# Patient Record
Sex: Female | Born: 1975 | Race: White | Hispanic: No | Marital: Married | Smoking: Current every day smoker
Health system: Southern US, Community
[De-identification: ages and names within clinical notes are randomized; demographics above are authoritative.]

## PROBLEM LIST (undated history)

## (undated) DIAGNOSIS — I2699 Other pulmonary embolism without acute cor pulmonale: Secondary | ICD-10-CM

## (undated) DIAGNOSIS — Z923 Personal history of irradiation: Secondary | ICD-10-CM

## (undated) DIAGNOSIS — D509 Iron deficiency anemia, unspecified: Secondary | ICD-10-CM

## (undated) DIAGNOSIS — I82409 Acute embolism and thrombosis of unspecified deep veins of unspecified lower extremity: Secondary | ICD-10-CM

## (undated) DIAGNOSIS — J189 Pneumonia, unspecified organism: Secondary | ICD-10-CM

## (undated) DIAGNOSIS — R011 Cardiac murmur, unspecified: Secondary | ICD-10-CM

## (undated) HISTORY — DX: Cardiac murmur, unspecified: R01.1

## (undated) HISTORY — PX: ANKLE FRACTURE SURGERY: SHX122

---

## 2003-11-29 HISTORY — PX: ANKLE FRACTURE SURGERY: SHX122

## 2015-08-05 ENCOUNTER — Other Ambulatory Visit: Payer: Self-pay | Admitting: Nurse Practitioner

## 2015-08-05 DIAGNOSIS — E049 Nontoxic goiter, unspecified: Secondary | ICD-10-CM

## 2015-08-07 ENCOUNTER — Ambulatory Visit
Admission: RE | Admit: 2015-08-07 | Discharge: 2015-08-07 | Disposition: A | Discharge status: Home / Self Care | Payer: BLUE CROSS/BLUE SHIELD | Source: Ambulatory Visit | Attending: Nurse Practitioner | Admitting: Nurse Practitioner

## 2015-08-07 DIAGNOSIS — E049 Nontoxic goiter, unspecified: Secondary | ICD-10-CM

## 2015-08-07 DIAGNOSIS — E042 Nontoxic multinodular goiter: Secondary | ICD-10-CM | POA: Insufficient documentation

## 2019-10-25 ENCOUNTER — Other Ambulatory Visit: Payer: Self-pay

## 2019-10-25 ENCOUNTER — Other Ambulatory Visit
Admission: RE | Admit: 2019-10-25 | Discharge: 2019-10-25 | Disposition: A | Discharge status: Home / Self Care | Payer: BC Managed Care – PPO | Source: Ambulatory Visit | Attending: Surgery | Admitting: Surgery

## 2019-10-25 DIAGNOSIS — Z01812 Encounter for preprocedural laboratory examination: Secondary | ICD-10-CM | POA: Diagnosis not present

## 2019-10-25 DIAGNOSIS — Z20828 Contact with and (suspected) exposure to other viral communicable diseases: Secondary | ICD-10-CM | POA: Insufficient documentation

## 2019-10-26 LAB — SARS CORONAVIRUS 2 (TAT 6-24 HRS): SARS Coronavirus 2: NEGATIVE

## 2019-10-28 ENCOUNTER — Other Ambulatory Visit: Payer: Self-pay | Admitting: Surgery

## 2019-10-28 DIAGNOSIS — S62025A Nondisplaced fracture of middle third of navicular [scaphoid] bone of left wrist, initial encounter for closed fracture: Secondary | ICD-10-CM | POA: Insufficient documentation

## 2019-10-29 ENCOUNTER — Encounter
Admission: RE | Admit: 2019-10-29 | Discharge: 2019-10-29 | Disposition: A | Discharge status: Home / Self Care | Payer: BC Managed Care – PPO | Source: Ambulatory Visit | Attending: Surgery | Admitting: Surgery

## 2019-10-29 ENCOUNTER — Other Ambulatory Visit: Payer: Self-pay

## 2019-10-29 NOTE — Patient Instructions (Signed)
Your procedure is scheduled on: October 30, 2019 [facility] Report to Day Surgery on the 2nd floor of the [facility]. To find out your arrival time, please call [phone] between 1PM - 3PM on: Tuesday October 29, 2019  REMEMBER: Instructions that are not followed completely may result in serious medical risk, up to and including death; or upon the discretion of your surgeon and anesthesiologist your surgery may need to be rescheduled.  Do not eat food after midnight the night before surgery.  No gum chewing, lozengers or hard candies.  You may however, drink CLEAR liquids up to 2 hours before you are scheduled to arrive for your surgery. Do not drink anything within 2 hours of the start of your surgery.  Clear liquids include: - water  - apple juice without pulp - gatorade - black coffee or tea (Do NOT add milk or creamers to the coffee or tea) Do NOT drink anything that is not on this list.  Type 1 and Type 2 diabetics should only drink water.  ENSURE PRE-SURGERY CARBOHYDRATE DRINK:  Complete drinking 3 hours prior to surgery. -PATIENT UNABLE TO PICK UP CARB [facility].  PATIENT INSTRUCTED TO GET GATORADE DRINK FROM STORE.  No Alcohol for 24 hours before or after surgery.  No Smoking including e-cigarettes for 24 hours prior to surgery.  No chewable tobacco products for at least 6 hours prior to surgery.  No nicotine patches on the day of surgery.  On the morning of surgery brush your teeth with toothpaste and water, you may rinse your mouth with mouthwash if you wish. Do not swallow any toothpaste or mouthwash.  Notify your doctor if there is any change in your medical condition (cold, fever, infection).  Do not wear jewelry, make-up, hairpins, clips or nail polish.  Do not wear lotions, powders, or perfumes.   Do not shave 48 hours prior to surgery.   Contacts and dentures may not be worn into surgery.  Do not bring valuables to the hospital, including  drivers license, insurance or credit cards.  [facility] is not responsible for any belongings or valuables.   TAKE THESE MEDICATIONS THE MORNING OF SURGERY: NONE  Stop Anti-inflammatories (NSAIDS) such as Advil, Aleve, Ibuprofen, Motrin, Naproxen, Naprosyn and Aspirin based products such as Excedrin, Goodys Powder, BC Powder. (May take Tylenol or Acetaminophen if needed.)  Stop ANY OVER THE COUNTER supplements until after surgery. (May continue Vitamin D, Vitamin B, and multivitamin.)  Wear comfortable clothing (specific to your surgery type) to the hospital.  Plan for stool softeners for home use.  If you are being discharged the day of surgery, you will not be allowed to drive home. You will need a responsible adult to drive you home and stay with you that night.   If you are taking public transportation, you will need to have a responsible adult with you. Please confirm with your physician that it is acceptable to use public transportation.   Please call [phone] if you have any questions about these instructions.

## 2019-10-29 NOTE — Pre-Procedure Instructions (Signed)
Instructions reviewed with patient over the phone

## 2019-10-30 ENCOUNTER — Ambulatory Visit: Payer: Worker's Compensation | Admitting: Anesthesiology

## 2019-10-30 ENCOUNTER — Other Ambulatory Visit: Payer: Self-pay

## 2019-10-30 ENCOUNTER — Ambulatory Visit
Admission: RE | Admit: 2019-10-30 | Discharge: 2019-10-30 | Disposition: A | Discharge status: Home / Self Care | Payer: Worker's Compensation | Attending: Surgery | Admitting: Surgery

## 2019-10-30 ENCOUNTER — Encounter
Admission: RE | Disposition: A | Discharge status: Home / Self Care | Payer: Self-pay | Source: Home / Self Care | Attending: Surgery

## 2019-10-30 DIAGNOSIS — W010XXA Fall on same level from slipping, tripping and stumbling without subsequent striking against object, initial encounter: Secondary | ICD-10-CM | POA: Insufficient documentation

## 2019-10-30 DIAGNOSIS — Z881 Allergy status to other antibiotic agents status: Secondary | ICD-10-CM | POA: Insufficient documentation

## 2019-10-30 DIAGNOSIS — S62012A Displaced fracture of distal pole of navicular [scaphoid] bone of left wrist, initial encounter for closed fracture: Secondary | ICD-10-CM | POA: Diagnosis not present

## 2019-10-30 DIAGNOSIS — Y990 Civilian activity done for income or pay: Secondary | ICD-10-CM | POA: Insufficient documentation

## 2019-10-30 DIAGNOSIS — F17200 Nicotine dependence, unspecified, uncomplicated: Secondary | ICD-10-CM | POA: Diagnosis not present

## 2019-10-30 HISTORY — PX: ORIF WRIST FRACTURE: SHX2133

## 2019-10-30 LAB — URINE DRUG SCREEN, QUALITATIVE (ARMC ONLY)
Amphetamines, Ur Screen: NOT DETECTED
Barbiturates, Ur Screen: NOT DETECTED
Benzodiazepine, Ur Scrn: NOT DETECTED
Cannabinoid 50 Ng, Ur ~~LOC~~: POSITIVE — AB
Cocaine Metabolite,Ur ~~LOC~~: NOT DETECTED
MDMA (Ecstasy)Ur Screen: NOT DETECTED
Methadone Scn, Ur: NOT DETECTED
Opiate, Ur Screen: NOT DETECTED
Phencyclidine (PCP) Ur S: NOT DETECTED
Tricyclic, Ur Screen: NOT DETECTED

## 2019-10-30 LAB — POCT PREGNANCY, URINE: Preg Test, Ur: NEGATIVE

## 2019-10-30 SURGERY — OPEN REDUCTION INTERNAL FIXATION (ORIF) WRIST FRACTURE
Anesthesia: General | Site: Wrist | Laterality: Left

## 2019-10-30 MED ORDER — FENTANYL CITRATE (PF) 100 MCG/2ML IJ SOLN
INTRAMUSCULAR | Status: AC
  Administered 2019-10-30: 25 ug via INTRAVENOUS
  Filled 2019-10-30: qty 2

## 2019-10-30 MED ORDER — BUPIVACAINE HCL (PF) 0.5 % IJ SOLN
INTRAMUSCULAR | Status: AC
  Filled 2019-10-30: qty 30

## 2019-10-30 MED ORDER — FENTANYL CITRATE (PF) 100 MCG/2ML IJ SOLN
25.0000 ug | INTRAMUSCULAR | Status: DC | PRN
  Administered 2019-10-30 (×4): 25 ug via INTRAVENOUS

## 2019-10-30 MED ORDER — OXYCODONE HCL 5 MG PO TABS
ORAL_TABLET | ORAL | Status: AC
  Filled 2019-10-30: qty 1

## 2019-10-30 MED ORDER — OXYCODONE HCL 5 MG PO TABS
5.0000 mg | ORAL_TABLET | Freq: Once | ORAL | Status: AC | PRN
  Administered 2019-10-30: 5 mg via ORAL

## 2019-10-30 MED ORDER — PROMETHAZINE HCL 25 MG/ML IJ SOLN: 6.2500 mg | INTRAMUSCULAR | Status: DC | PRN

## 2019-10-30 MED ORDER — OXYCODONE HCL 5 MG/5ML PO SOLN: 5.0000 mg | Freq: Once | ORAL | Status: AC | PRN

## 2019-10-30 MED ORDER — PROPOFOL 10 MG/ML IV BOLUS
INTRAVENOUS | Status: AC
  Filled 2019-10-30: qty 20

## 2019-10-30 MED ORDER — FAMOTIDINE 20 MG PO TABS
ORAL_TABLET | ORAL | Status: AC
  Administered 2019-10-30: 20 mg via ORAL
  Filled 2019-10-30: qty 1

## 2019-10-30 MED ORDER — DEXAMETHASONE SODIUM PHOSPHATE 10 MG/ML IJ SOLN
INTRAMUSCULAR | Status: DC | PRN
  Administered 2019-10-30: 10 mg via INTRAVENOUS

## 2019-10-30 MED ORDER — ONDANSETRON HCL 4 MG/2ML IJ SOLN
INTRAMUSCULAR | Status: DC | PRN
  Administered 2019-10-30: 4 mg via INTRAVENOUS

## 2019-10-30 MED ORDER — MIDAZOLAM HCL 2 MG/2ML IJ SOLN
INTRAMUSCULAR | Status: DC | PRN
  Administered 2019-10-30: 2 mg via INTRAVENOUS

## 2019-10-30 MED ORDER — MIDAZOLAM HCL 2 MG/2ML IJ SOLN
INTRAMUSCULAR | Status: AC
  Filled 2019-10-30: qty 2

## 2019-10-30 MED ORDER — CEFAZOLIN SODIUM-DEXTROSE 2-4 GM/100ML-% IV SOLN
INTRAVENOUS | Status: AC
  Filled 2019-10-30: qty 100

## 2019-10-30 MED ORDER — LIDOCAINE HCL (CARDIAC) PF 100 MG/5ML IV SOSY
PREFILLED_SYRINGE | INTRAVENOUS | Status: DC | PRN
  Administered 2019-10-30: 80 mg via INTRAVENOUS

## 2019-10-30 MED ORDER — BUPIVACAINE HCL (PF) 0.5 % IJ SOLN
INTRAMUSCULAR | Status: DC | PRN
  Administered 2019-10-30: 3 mL

## 2019-10-30 MED ORDER — FAMOTIDINE 20 MG PO TABS
20.0000 mg | ORAL_TABLET | Freq: Once | ORAL | Status: AC
  Administered 2019-10-30: 08:00:00 20 mg via ORAL

## 2019-10-30 MED ORDER — FENTANYL CITRATE (PF) 100 MCG/2ML IJ SOLN
INTRAMUSCULAR | Status: DC | PRN
  Administered 2019-10-30 (×2): 50 ug via INTRAVENOUS

## 2019-10-30 MED ORDER — MEPERIDINE HCL 50 MG/ML IJ SOLN: 6.2500 mg | INTRAMUSCULAR | Status: DC | PRN

## 2019-10-30 MED ORDER — LACTATED RINGERS IV SOLN
INTRAVENOUS | Status: DC
  Administered 2019-10-30: 50 mL/h via INTRAVENOUS

## 2019-10-30 MED ORDER — PROPOFOL 10 MG/ML IV BOLUS
INTRAVENOUS | Status: DC | PRN
  Administered 2019-10-30: 150 mg via INTRAVENOUS

## 2019-10-30 MED ORDER — CEFAZOLIN SODIUM-DEXTROSE 2-4 GM/100ML-% IV SOLN
2.0000 g | INTRAVENOUS | Status: AC
  Administered 2019-10-30: 09:00:00 2 g via INTRAVENOUS

## 2019-10-30 MED ORDER — FENTANYL CITRATE (PF) 250 MCG/5ML IJ SOLN
INTRAMUSCULAR | Status: AC
  Filled 2019-10-30: qty 5

## 2019-10-30 MED ORDER — CHLORHEXIDINE GLUCONATE 4 % EX LIQD
60.0000 mL | Freq: Once | CUTANEOUS | Status: AC
  Administered 2019-10-30: 4 via TOPICAL

## 2019-10-30 SURGICAL SUPPLY — 46 items
BIT DRILL CANN 2.4 (BIT) ×2
BIT DRILL CANN MAX VPC 2.4 (BIT) IMPLANT
BNDG COHESIVE 4X5 TAN STRL (GAUZE/BANDAGES/DRESSINGS) ×2 IMPLANT
BNDG ESMARK 4X12 TAN STRL LF (GAUZE/BANDAGES/DRESSINGS) ×2 IMPLANT
CANISTER SUCT 1200ML W/VALVE (MISCELLANEOUS) ×2 IMPLANT
CHLORAPREP W/TINT 26 (MISCELLANEOUS) ×4 IMPLANT
CORD BIP STRL DISP 12FT (MISCELLANEOUS) ×2 IMPLANT
COVER WAND RF STERILE (DRAPES) ×2 IMPLANT
CUFF TOURN DUAL QUICK 18 (TOURNIQUET CUFF) ×1 IMPLANT
CUFF TOURN SGL QUICK 18X4 (TOURNIQUET CUFF) IMPLANT
DRAPE FLUOR MINI C-ARM 54X84 (DRAPES) ×2 IMPLANT
DRAPE SPLIT 6X30 W/TAPE (DRAPES) ×2 IMPLANT
DRAPE SURG 17X11 SM STRL (DRAPES) ×2 IMPLANT
DRAPE U-SHAPE 47X51 STRL (DRAPES) ×2 IMPLANT
ELECT CAUTERY BLADE 6.4 (BLADE) ×2 IMPLANT
ELECT REM PT RETURN 9FT ADLT (ELECTROSURGICAL) ×2
ELECTRODE REM PT RTRN 9FT ADLT (ELECTROSURGICAL) ×1 IMPLANT
FORCEPS JEWEL BIP 4-3/4 STR (INSTRUMENTS) ×2 IMPLANT
GAUZE SPONGE 4X4 12PLY STRL (GAUZE/BANDAGES/DRESSINGS) ×2 IMPLANT
GAUZE XEROFORM 1X8 LF (GAUZE/BANDAGES/DRESSINGS) ×2 IMPLANT
GLOVE BIO SURGEON STRL SZ8 (GLOVE) ×2 IMPLANT
GLOVE INDICATOR 8.0 STRL GRN (GLOVE) ×2 IMPLANT
GOWN STRL REUS W/ TWL LRG LVL3 (GOWN DISPOSABLE) ×1 IMPLANT
GOWN STRL REUS W/ TWL XL LVL3 (GOWN DISPOSABLE) ×1 IMPLANT
GOWN STRL REUS W/TWL LRG LVL3 (GOWN DISPOSABLE) ×1
GOWN STRL REUS W/TWL XL LVL3 (GOWN DISPOSABLE) ×1
K-WIRE COCR 1.1X105 (WIRE) ×2
KIT TURNOVER KIT A (KITS) ×2 IMPLANT
KWIRE COCR 1.1X105 (WIRE) IMPLANT
NDL FILTER BLUNT 18X1 1/2 (NEEDLE) ×1 IMPLANT
NEEDLE FILTER BLUNT 18X 1/2SAF (NEEDLE) ×1
NEEDLE FILTER BLUNT 18X1 1/2 (NEEDLE) ×1 IMPLANT
NS IRRIG 500ML POUR BTL (IV SOLUTION) ×2 IMPLANT
PACK EXTREMITY ARMC (MISCELLANEOUS) ×2 IMPLANT
PAD CAST CTTN 4X4 STRL (SOFTGOODS) ×2 IMPLANT
PADDING CAST COTTON 4X4 STRL (SOFTGOODS) ×2
SCREW MAX VPC 3.4X24MM (Screw) ×1 IMPLANT
SPLINT CAST 1 STEP 3X12 (MISCELLANEOUS) ×2 IMPLANT
STAPLER SKIN PROX 35W (STAPLE) ×1 IMPLANT
STOCKINETTE IMPERVIOUS 9X36 MD (GAUZE/BANDAGES/DRESSINGS) ×2 IMPLANT
SUT PROLENE 4 0 PS 2 18 (SUTURE) ×2 IMPLANT
SUT VIC AB 2-0 SH 27 (SUTURE) ×1
SUT VIC AB 2-0 SH 27XBRD (SUTURE) ×1 IMPLANT
SUT VIC AB 3-0 SH 27 (SUTURE) ×1
SUT VIC AB 3-0 SH 27X BRD (SUTURE) ×1 IMPLANT
SYR 10ML LL (SYRINGE) ×2 IMPLANT

## 2019-10-30 NOTE — Anesthesia Post-op Follow-up Note (Signed)
Anesthesia QCDR form completed.        

## 2019-10-30 NOTE — Op Note (Signed)
10/30/2019  9:40 AM  Pre-Op Diagnosis:   Scaphoid waist fracture, left wrist.  Post-Op Diagnosis:   Same.  Procedure:   Percutaneous screw fixation of scaphoid fracture, left wrist.  Surgeon:   Pascal Lux, MD  Assistant:   Larrie Kass, PA-S  Anesthesia:   General LMA  Findings:   As above.  Complications:   None  Fluids:   500 cc crystalloid  EBL:   0 cc  TT:   30 minutes at 250 mmHg  Drains:   None  Closure:   4-0 Prolene interrupted suture  Implants:   Biomet 3.4 x 24 mm Herbert screw  Brief Clinical Note:   The patient is a 43 year old female who sustained the above-noted injury 2 weeks ago following a fall onto her outstretched left hand. Initial x-rays demonstrated an essentially nondisplaced waist fracture of the left scaphoid. However, follow-up x-rays suggested some displacement of the fracture, so she presents at this time for definitive management of this injury.  Procedure:   The patient was brought into the operating room and lain in the supine position. After adequate general laryngeal mask anesthesia was obtained, the patient's left hand and upper extremity were prepped with ChloraPrep solution before being draped sterilely. Preoperative antibiotics were administered. After performing a timeout to verify the appropriate surgical site, the limb was exsanguinated with an Esmarch and the tourniquet inflated to 250 mmHg.   Under FluoroScan guidance, a short stab incision was made at the volar radial aspect of the scaphoid. A 1.1 mm guidewire was inserted across the scaphoid beginning at the base of the thumb and advancing it proximally and dorsally into the proximal fragment. After several attempts, the optimal position was obtained. The position of the guidewire was verified in in AP, lateral, and oblique projections. The guidewire was measured to determine the appropriate screw length before it overdrilled with the appropriate sized cannulated drill. The 3.4 x  24 mm Herbert screw was then inserted and advanced across the fracture into the proximal fragment under FluoroScan guidance where the fracture site was noted to compress as expected. Again, the position of the screw was verified in AP, lateral, and oblique projections using the FluoroScan unit and found to be satisfactory.  The wound was irrigated with sterile saline solution before being closed using a single 4-0 Prolene interrupted suture. A total of 3 cc of 0.5% plain Sensorcaine was injected in and around the incision to help with postoperative analgesia before a sterile bulky dressing was applied to the wound. The patient was placed into a thumb spica splint before the patient was awakened, extubated, and returned to the recovery room in satisfactory condition after tolerating the procedure well.

## 2019-10-30 NOTE — Transfer of Care (Signed)
Immediate Anesthesia Transfer of Care Note  Patient: Maria Young  Procedure(s) Performed: OPEN REDUCTION INTERNAL FIXATION (ORIF) WRIST FRACTURE with percutaneous screw (Left Wrist)  Patient Location: PACU  Anesthesia Type:General  Level of Consciousness: awake, alert  and oriented  Airway & Oxygen Therapy: Patient Spontanous Breathing and Patient connected to face mask oxygen  Post-op Assessment: Report given to RN and Post -op Vital signs reviewed and stable  Post vital signs: Reviewed and stable  Last Vitals:  Vitals Value Taken Time  BP 135/93 10/30/19 0935  Temp 36.4 C 10/30/19 0935  Pulse 73 10/30/19 0937  Resp 16 10/30/19 0937  SpO2 100 % 10/30/19 0937  Vitals shown include unvalidated device data.  Last Pain:  Vitals:   10/30/19 0725  TempSrc: Tympanic  PainSc: 0-No pain         Complications: No apparent anesthesia complications

## 2019-10-30 NOTE — Anesthesia Preprocedure Evaluation (Signed)
Anesthesia Evaluation  Patient identified by MRN, date of birth, ID band Patient awake    Reviewed: Allergy & Precautions, NPO status , Patient's Chart, lab work & pertinent test results  History of Anesthesia Complications Negative for: history of anesthetic complications  Airway Mallampati: II  TM Distance: >3 FB Neck ROM: Full    Dental  (+) Caps   Pulmonary neg sleep apnea, neg COPD, Current SmokerPatient did not abstain from smoking.,    breath sounds clear to auscultation- rhonchi (-) wheezing      Cardiovascular Exercise Tolerance: Good (-) hypertension(-) CAD, (-) Past MI, (-) Cardiac Stents and (-) CABG  Rhythm:Regular Rate:Normal - Systolic murmurs and - Diastolic murmurs    Neuro/Psych neg Seizures negative neurological ROS  negative psych ROS   GI/Hepatic negative GI ROS, Neg liver ROS,   Endo/Other  negative endocrine ROSneg diabetes  Renal/GU negative Renal ROS     Musculoskeletal negative musculoskeletal ROS (+)   Abdominal (+) - obese,   Peds  Hematology negative hematology ROS (+)   Anesthesia Other Findings   Reproductive/Obstetrics                             Anesthesia Physical Anesthesia Plan  ASA: II  Anesthesia Plan: General   Post-op Pain Management:    Induction: Intravenous  PONV Risk Score and Plan: 1 and Ondansetron, Dexamethasone and Midazolam  Airway Management Planned: LMA  Additional Equipment:   Intra-op Plan:   Post-operative Plan:   Informed Consent: I have reviewed the patients History and Physical, chart, labs and discussed the procedure including the risks, benefits and alternatives for the proposed anesthesia with the patient or authorized representative who has indicated his/her understanding and acceptance.     Dental advisory given  Plan Discussed with: CRNA and Anesthesiologist  Anesthesia Plan Comments:          Anesthesia Quick Evaluation

## 2019-10-30 NOTE — H&P (Signed)
Paper H&P to be scanned into permanent record. H&P reviewed and patient re-examined. No changes. 

## 2019-10-30 NOTE — Anesthesia Postprocedure Evaluation (Signed)
Anesthesia Post Note  Patient: Ledora Bottcher  Procedure(s) Performed: OPEN REDUCTION INTERNAL FIXATION (ORIF) WRIST FRACTURE with percutaneous screw (Left Wrist)  Patient location during evaluation: PACU Anesthesia Type: General Level of consciousness: awake and alert and oriented Pain management: pain level controlled Vital Signs Assessment: post-procedure vital signs reviewed and stable Respiratory status: spontaneous breathing, nonlabored ventilation and respiratory function stable Cardiovascular status: blood pressure returned to baseline and stable Postop Assessment: no signs of nausea or vomiting Anesthetic complications: no     Last Vitals:  Vitals:   10/30/19 1020 10/30/19 1028  BP: 134/79 126/75  Pulse: 62   Resp: 17   Temp: (!) 36.4 C   SpO2: 100%     Last Pain:  Vitals:   10/30/19 1020  TempSrc: Tympanic  PainSc: 4                  Gilman Olazabal

## 2019-10-30 NOTE — Discharge Instructions (Addendum)
° °  Orthopedic discharge instructions: Keep splint dry and intact. Keep hand elevated above heart level. Apply ice to affected area frequently. Take ibuprofen 600-800 mg TID with meals for 7-10 days, then as necessary. Take pain medication as prescribed or ES Tylenol when needed.  Return for follow-up in 10-14 days or as scheduled.   AMBULATORY SURGERY  DISCHARGE INSTRUCTIONS   1) The drugs that you were given will stay in your system until tomorrow so for the next 24 hours you should not:  A) Drive an automobile B) Make any legal decisions C) Drink any alcoholic beverage   2) You may resume regular meals tomorrow.  Today it is better to start with liquids and gradually work up to solid foods.  You may eat anything you prefer, but it is better to start with liquids, then soup and crackers, and gradually work up to solid foods.   3) Please notify your doctor immediately if you have any unusual bleeding, trouble breathing, redness and pain at the surgery site, drainage, fever, or pain not relieved by medication.    4) Additional Instructions:        Please contact your physician with any problems or Same Day Surgery at [phone], Monday through Friday 6 am to 4 pm, or [place] at [facility] number at [phone].  Oxycodone 108m IR taken 10/30/19 at 10:05am

## 2019-10-31 ENCOUNTER — Encounter: Payer: Self-pay | Admitting: Surgery

## 2019-11-19 ENCOUNTER — Encounter: Payer: Self-pay | Admitting: *Deleted

## 2020-03-12 ENCOUNTER — Ambulatory Visit: Payer: BC Managed Care – PPO

## 2020-03-13 ENCOUNTER — Ambulatory Visit: Payer: BC Managed Care – PPO

## 2020-03-13 ENCOUNTER — Ambulatory Visit: Payer: Self-pay | Attending: Internal Medicine

## 2020-03-13 ENCOUNTER — Other Ambulatory Visit: Payer: Self-pay

## 2020-03-13 DIAGNOSIS — Z23 Encounter for immunization: Secondary | ICD-10-CM

## 2020-03-13 NOTE — Progress Notes (Signed)
   Covid-19 Vaccination Clinic  Name:  Maria Young    MRN: [id number] DOB: [birth year]  03/13/2020  [name] was observed post Covid-19 immunization for 15 minutes without incident. She was provided with Vaccine Information Sheet and instruction to access the V-Safe system.   [name] was instructed to call 911 with any severe reactions post vaccine: Marland Kitchen Difficulty breathing  . Swelling of face and throat  . A fast heartbeat  . A bad rash all over body  . Dizziness and weakness   Immunizations Administered    Name Date Dose VIS Date Route   Pfizer COVID-19 Vaccine 03/13/2020 10:20 AM 0.3 mL 11/08/2019 Intramuscular   Manufacturer: [employer]   Lot: [id number]   [place]: [address]-1

## 2020-04-08 ENCOUNTER — Ambulatory Visit: Payer: Self-pay | Attending: Internal Medicine

## 2020-04-08 DIAGNOSIS — Z23 Encounter for immunization: Secondary | ICD-10-CM

## 2020-04-08 NOTE — Progress Notes (Signed)
   Covid-19 Vaccination Clinic  Name:  Maria Young    MRN: [id number] DOB: 1976/02/15  04/08/2020  [name] was observed post Covid-19 immunization for 15 minutes without incident. She was provided with Vaccine Information Sheet and instruction to access the V-Safe system.   [name] was instructed to call 911 with any severe reactions post vaccine: Marland Kitchen Difficulty breathing  . Swelling of face and throat  . A fast heartbeat  . A bad rash all over body  . Dizziness and weakness   Immunizations Administered    Name Date Dose VIS Date Route   Pfizer COVID-19 Vaccine 04/08/2020 10:33 AM 0.3 mL 01/22/2019 Intramuscular   Manufacturer: [place]   Lot: [id number]   [place]: [id number]

## 2020-06-11 ENCOUNTER — Other Ambulatory Visit
Admission: RE | Admit: 2020-06-11 | Discharge: 2020-06-11 | Disposition: A | Discharge status: Home / Self Care | Payer: BC Managed Care – PPO | Source: Ambulatory Visit | Attending: Family Medicine | Admitting: Family Medicine

## 2020-06-11 ENCOUNTER — Emergency Department: Payer: BC Managed Care – PPO

## 2020-06-11 ENCOUNTER — Other Ambulatory Visit: Payer: Self-pay

## 2020-06-11 ENCOUNTER — Emergency Department
Admission: EM | Admit: 2020-06-11 | Discharge: 2020-06-11 | Disposition: A | Discharge status: Home / Self Care | Payer: BC Managed Care – PPO | Attending: Emergency Medicine | Admitting: Emergency Medicine

## 2020-06-11 ENCOUNTER — Encounter: Payer: Self-pay | Admitting: Emergency Medicine

## 2020-06-11 DIAGNOSIS — R0789 Other chest pain: Secondary | ICD-10-CM | POA: Diagnosis present

## 2020-06-11 DIAGNOSIS — I2699 Other pulmonary embolism without acute cor pulmonale: Secondary | ICD-10-CM | POA: Insufficient documentation

## 2020-06-11 DIAGNOSIS — R7989 Other specified abnormal findings of blood chemistry: Secondary | ICD-10-CM | POA: Insufficient documentation

## 2020-06-11 DIAGNOSIS — F17210 Nicotine dependence, cigarettes, uncomplicated: Secondary | ICD-10-CM | POA: Diagnosis not present

## 2020-06-11 HISTORY — DX: Other pulmonary embolism without acute cor pulmonale: I26.99

## 2020-06-11 LAB — CBC
HCT: 39.8 % (ref 36.0–46.0)
Hemoglobin: 13.9 g/dL (ref 12.0–15.0)
MCH: 34.2 pg — ABNORMAL HIGH (ref 26.0–34.0)
MCHC: 34.9 g/dL (ref 30.0–36.0)
MCV: 97.8 fL (ref 80.0–100.0)
Platelets: 235 10*3/uL (ref 150–400)
RBC: 4.07 MIL/uL (ref 3.87–5.11)
RDW: 11.9 % (ref 11.5–15.5)
WBC: 6.5 10*3/uL (ref 4.0–10.5)
nRBC: 0 % (ref 0.0–0.2)

## 2020-06-11 LAB — TROPONIN I (HIGH SENSITIVITY)
Troponin I (High Sensitivity): 3 ng/L (ref <18)
Troponin I (High Sensitivity): 3 ng/L (ref <18)

## 2020-06-11 LAB — POCT PREGNANCY, URINE: Preg Test, Ur: NEGATIVE

## 2020-06-11 LAB — BASIC METABOLIC PANEL
Anion gap: 7 (ref 5–15)
BUN: 10 mg/dL (ref 6–20)
CO2: 23 mmol/L (ref 22–32)
Calcium: 9.2 mg/dL (ref 8.9–10.3)
Chloride: 107 mmol/L (ref 98–111)
Creatinine, Ser: 0.72 mg/dL (ref 0.44–1.00)
GFR calc Af Amer: >60 mL/min (ref >60)
GFR calc non Af Amer: >60 mL/min (ref >60)
Glucose, Bld: 109 mg/dL — ABNORMAL HIGH (ref 70–99)
Potassium: 3.7 mmol/L (ref 3.5–5.1)
Sodium: 137 mmol/L (ref 135–145)

## 2020-06-11 LAB — FIBRIN DERIVATIVES D-DIMER (ARMC ONLY): Fibrin derivatives D-dimer (ARMC): 1080.16 ng/mL (FEU) — ABNORMAL HIGH (ref 0.00–499.00)

## 2020-06-11 MED ORDER — ONDANSETRON HCL 4 MG/2ML IJ SOLN
4.0000 mg | Freq: Once | INTRAMUSCULAR | Status: AC
  Administered 2020-06-11: 4 mg via INTRAVENOUS
  Filled 2020-06-11: qty 2

## 2020-06-11 MED ORDER — ONDANSETRON HCL 8 MG PO TABS: 8.0000 mg | ORAL_TABLET | Freq: Three times a day (TID) | ORAL | 0 refills | Status: DC | PRN

## 2020-06-11 MED ORDER — OXYCODONE-ACETAMINOPHEN 5-325 MG PO TABS: 1.0000 | ORAL_TABLET | ORAL | 0 refills | Status: DC | PRN

## 2020-06-11 MED ORDER — APIXABAN (ELIQUIS) VTE STARTER PACK (10MG AND 5MG): ORAL_TABLET | ORAL | 0 refills | Status: DC

## 2020-06-11 MED ORDER — MORPHINE SULFATE (PF) 4 MG/ML IV SOLN
4.0000 mg | Freq: Once | INTRAVENOUS | Status: DC
  Filled 2020-06-11: qty 1

## 2020-06-11 MED ORDER — APIXABAN 5 MG PO TABS: 5.0000 mg | ORAL_TABLET | Freq: Two times a day (BID) | ORAL | 0 refills | Status: DC

## 2020-06-11 MED ORDER — ONDANSETRON HCL 8 MG PO TABS: 8.0000 mg | ORAL_TABLET | Freq: Three times a day (TID) | ORAL | 0 refills | Status: AC | PRN

## 2020-06-11 MED ORDER — IOHEXOL 350 MG/ML SOLN
75.0000 mL | Freq: Once | INTRAVENOUS | Status: AC | PRN
  Administered 2020-06-11: 75 mL via INTRAVENOUS
  Filled 2020-06-11: qty 75

## 2020-06-11 MED ORDER — APIXABAN 5 MG PO TABS
10.0000 mg | ORAL_TABLET | Freq: Once | ORAL | Status: AC
  Administered 2020-06-11: 10 mg via ORAL
  Filled 2020-06-11: qty 2

## 2020-06-11 MED ORDER — OXYCODONE-ACETAMINOPHEN 5-325 MG PO TABS
1.0000 | ORAL_TABLET | Freq: Once | ORAL | Status: AC
  Administered 2020-06-11: 1 via ORAL
  Filled 2020-06-11: qty 1

## 2020-06-11 NOTE — ED Provider Notes (Signed)
-----------------------------------------   8:57 PM on 06/11/2020 -----------------------------------------  Blood pressure (!) 154/83, pulse (!) 54, temperature 98.1 F (36.7 C), temperature source Oral, resp. rate 18, height 6' 2"  (1.88 m), weight 86.2 kg, SpO2 99 %.  Assuming care from [facility], NP-C.  In short, Maria Young is a 44 y.o. female with a chief complaint of Chest Pain .  Refer to the original H&P for additional details.  The current plan of care is to await CTA results and disposition appropriately.  _____________________________________  RADIOLOGY  CT Angio Chest PE w w/o CM  IMPRESSION: 1. Positive for small, distal acute pulmonary emboli in the bilateral lower lobes. Negative for proximal or central embolus.  2. Mild pulmonary atelectasis. No convincing pulmonary infarct. No pleural effusion.  3. Subpleural 6 mm right middle lobe pulmonary nodule. Non-contrast chest CT at 6-12 months is recommended. If the nodule is stable at time of repeat CT, then future CT at 18-24 months (from today's scan) is considered optional for low-risk patients, but is recommended for high-risk patients. This recommendation follows the consensus statement: Guidelines for Management of Incidental Pulmonary Nodules Detected on CT Images: From the Fleischner Society 2017; Radiology 2017; 284:228-243. ____________________________________________  PROCEDURES  apixaban 10 mg PO _______________________________________________  DISPOSITION  Patient with confirmed bilateral, distal, small pulmonary emboli on CT angio. She reports improvement of her pain with pain medicine and with upright positioning. She will be started in apixaban in the ED. She is hemodynamically stable and without acute distress. She will be managed in the outpatient setting, and referred to [facility] pulmonary medicine. Bleeding precautions and NSAID avoidance information is provided. Return precautions have also  been reviewed.    Melvenia Needles, PA-C 06/11/20 2102    Arta Silence, MD 06/11/20 [phone]

## 2020-06-11 NOTE — ED Triage Notes (Signed)
Pt in via POV, sent over from [facility] for further evaluation of an elevated d-dimer.  Pt reports intermittent chest pain to left lung base, worse with deep inhalation, sudden onset last night.  Vitals WDL.  Pt tearful in triage.

## 2020-06-11 NOTE — ED Notes (Signed)
Pt presentation discussed with EDP, Archie Balboa; no new orders at this time.

## 2020-06-11 NOTE — Discharge Instructions (Addendum)
Your exam, labs, and CT scan are consistent with a pulmonary embolism. Take the prescription meds as directed. Follow-up with Pulmonary Medicine for further management. Return to the ED as needed. Avoid anti-inflammatories while on this therapy.

## 2020-06-11 NOTE — ED Notes (Signed)
ED Provider at bedside. 

## 2020-06-11 NOTE — ED Provider Notes (Signed)
[facility] Emergency Department Provider Note  ____________________________________________   First MD Initiated Contact with Patient 06/11/20 1721     (approximate)  I have reviewed the triage vital signs and the nursing notes.   HISTORY  Chief Complaint Chest Pain   HPI Maria Young is a 44 y.o. female presents to the emergency department for treatment and evaluation of pain in the left anterior chest.  Pain started suddenly and awakened her from sleep last night.  Pain increases with deep breath.   No injury.  No previous DVT or PE.  No family history of DVT or PE that she is aware of.  She does state cigarettes.  She was sent here from [facility] clinic after having an elevated D-dimer.  No alleviating measures attempted prior to arrival  History reviewed. No pertinent past medical history.  There are no problems to display for this patient.   Past Surgical History:  Procedure Laterality Date   ANKLE FRACTURE SURGERY Right    internal fixation   ORIF WRIST FRACTURE Left 10/30/2019   Procedure: OPEN REDUCTION INTERNAL FIXATION (ORIF) WRIST FRACTURE with percutaneous screw;  Surgeon: Corky Mull, MD;  Location: [facility] ORS;  Service: Orthopedics;  Laterality: Left;    Prior to Admission medications   Medication Sig Start Date End Date Taking? Authorizing Provider  ibuprofen (ADVIL) 200 MG tablet Take 600 mg by mouth every 8 (eight) hours as needed (pain).    [provider]    Allergies Azithromycin  No family history on file.  Social History Social History   Tobacco Use   Smoking status: Current Every Day Smoker    Packs/day: 0.50    Types: Cigarettes   Smokeless tobacco: Never Used  Vaping Use   Vaping Use: Never used  Substance Use Topics   Alcohol use: Yes    Alcohol/week: 14.0 standard drinks    Types: 14 Glasses of wine per week   Drug use: Yes    Types: Marijuana    Review of Systems  Constitutional: No  fever/chills. Eyes: No visual changes. ENT: No sore throat. Cardiovascular: Positive for chest pain.  Positive for pleuritic pain.  Negative for palpitations.  Negative for leg pain. Respiratory: Negative for shortness of breath. Gastrointestinal: No abdominal pain.  No nausea, no vomiting.  No diarrhea.  No constipation. Genitourinary: Negative for dysuria. Musculoskeletal: Negative for back pain.  Skin: Negative for rash, lesion, wound. Neurological: Negative for headaches, focal weakness or numbness. ____________________________________________   PHYSICAL EXAM:  VITAL SIGNS: ED Triage Vitals  Enc Vitals Group     BP 06/11/20 1519 (!) 157/84     Pulse Rate 06/11/20 1519 68     Resp 06/11/20 1519 18     Temp 06/11/20 1519 98.1 F (36.7 C)     Temp Source 06/11/20 1519 Oral     SpO2 06/11/20 1519 98 %     Weight 06/11/20 1532 190 lb (86.2 kg)     Height 06/11/20 1532 6' 2"  (1.88 m)     Head Circumference --      Peak Flow --      Pain Score 06/11/20 1532 3     Pain Loc --      Pain Edu? --      Excl. in [place]? --     Constitutional: Alert and oriented.  Uncomfortable appearing and in no acute distress.  Normal mental status. Eyes: Conjunctivae are normal. PERRL. Head: Atraumatic. Nose: No congestion/rhinnorhea. Mouth/Throat: Mucous  membranes are moist.  Oropharynx non-erythematous. Tongue normal in size and color. Neck: No stridor.  No carotid bruit appreciated on exam. Hematological/Lymphatic/Immunilogical: No cervical lymphadenopathy. Cardiovascular: Normal rate, regular rhythm. Grossly normal heart sounds.  Good peripheral circulation. Respiratory: Normal respiratory effort.  No retractions. Lungs CTAB. Gastrointestinal: Soft and nontender. No distention. No abdominal bruits. No CVA tenderness. Genitourinary: Exam deferred. Musculoskeletal: No lower extremity tenderness.  No edema of extremities. Neurologic:  Normal speech and language. No gross focal neurologic  deficits are appreciated. Skin:  Skin is warm, dry and intact. No rash noted. Psychiatric: Mood and affect are normal. Speech and behavior are normal.  ____________________________________________   LABS (all labs ordered are listed, but only abnormal results are displayed)  Labs Reviewed  BASIC METABOLIC PANEL - Abnormal; Notable for the following components:      Result Value   Glucose, Bld 109 (*)    All other components within normal limits  CBC - Abnormal; Notable for the following components:   MCH 34.2 (*)    All other components within normal limits  POC URINE PREG, ED  POCT PREGNANCY, URINE  TROPONIN I (HIGH SENSITIVITY)  TROPONIN I (HIGH SENSITIVITY)   ____________________________________________  EKG  ED ECG REPORT I, Earla Charlie, FNP-BC personally viewed and interpreted this ECG.   Date: 06/11/2020  EKG Time: 1533  Rate: 73  Rhythm: normal EKG, normal sinus rhythm  Axis: normal  Intervals:none  ST&T Change: no ST elevation  ____________________________________________  RADIOLOGY  ED MD interpretation:  pending  I, Hanae Waiters, personally viewed and evaluated these images (plain radiographs) as part of my medical decision making, as well as reviewing the written report by the radiologist.  Official radiology report(s): No results found.  ____________________________________________   PROCEDURES  Procedure(s) performed: None  Procedures  Critical Care performed: No  ____________________________________________   INITIAL IMPRESSION / ASSESSMENT AND PLAN / ED COURSE  44 year old female presents emergency department for treatment and evaluation of sudden onset left anterior chest wall pain.  See HPI for further details.  She presented to care clinic initially and had lab work done.  Her D-dimer was elevated to 1080.16.  She continues to experience significant left anterior chest wall pain.  Plan will be to get a CT for PE.   ____________________________________________  Differential diagnosis includes, but not limited to:  Pulmonary embolus, costochondritis, NSTEMI   FINAL CLINICAL IMPRESSION(S) / ED DIAGNOSES  Awaiting CT.  Patient care will be transferred to [facility] PA-C who will follow up on results.  Final diagnoses:  Elevated d-dimer     ED Discharge Orders    None       Maria Young was evaluated in Emergency Department on 06/11/2020 for the symptoms described in the history of present illness. She was evaluated in the context of the global COVID-19 pandemic, which necessitated consideration that the patient might be at risk for infection with the SARS-CoV-2 virus that causes COVID-19. Institutional protocols and algorithms that pertain to the evaluation of patients at risk for COVID-19 are in a state of rapid change based on information released by regulatory bodies including the CDC and federal and state organizations. These policies and algorithms were followed during the patient's care in the ED.   Note:  This document was prepared using Dragon voice recognition software and may include unintentional dictation errors.   Victorino Dike, FNP 06/11/20 [place] Drop    Lavonia Drafts, MD 06/11/20 2024

## 2020-06-25 ENCOUNTER — Other Ambulatory Visit: Payer: Self-pay | Admitting: Internal Medicine

## 2020-06-25 DIAGNOSIS — Z1231 Encounter for screening mammogram for malignant neoplasm of breast: Secondary | ICD-10-CM

## 2020-10-20 ENCOUNTER — Other Ambulatory Visit: Payer: Self-pay | Admitting: Specialist

## 2020-10-20 DIAGNOSIS — F17200 Nicotine dependence, unspecified, uncomplicated: Secondary | ICD-10-CM

## 2020-10-20 DIAGNOSIS — R911 Solitary pulmonary nodule: Secondary | ICD-10-CM

## 2020-10-20 DIAGNOSIS — I2699 Other pulmonary embolism without acute cor pulmonale: Secondary | ICD-10-CM

## 2020-12-07 ENCOUNTER — Ambulatory Visit: Payer: BC Managed Care – PPO

## 2020-12-18 ENCOUNTER — Other Ambulatory Visit: Payer: Self-pay

## 2020-12-18 ENCOUNTER — Ambulatory Visit
Admission: RE | Admit: 2020-12-18 | Discharge: 2020-12-18 | Disposition: A | Discharge status: Home / Self Care | Payer: BC Managed Care – PPO | Source: Ambulatory Visit | Attending: Specialist | Admitting: Specialist

## 2020-12-18 DIAGNOSIS — I2699 Other pulmonary embolism without acute cor pulmonale: Secondary | ICD-10-CM | POA: Diagnosis present

## 2020-12-18 DIAGNOSIS — F17200 Nicotine dependence, unspecified, uncomplicated: Secondary | ICD-10-CM | POA: Insufficient documentation

## 2020-12-18 DIAGNOSIS — R911 Solitary pulmonary nodule: Secondary | ICD-10-CM | POA: Diagnosis not present

## 2020-12-30 ENCOUNTER — Other Ambulatory Visit (HOSPITAL_COMMUNITY): Payer: Self-pay | Admitting: Specialist

## 2020-12-30 ENCOUNTER — Other Ambulatory Visit: Payer: Self-pay | Admitting: Specialist

## 2020-12-30 DIAGNOSIS — F17200 Nicotine dependence, unspecified, uncomplicated: Secondary | ICD-10-CM

## 2020-12-30 DIAGNOSIS — R911 Solitary pulmonary nodule: Secondary | ICD-10-CM

## 2021-01-26 ENCOUNTER — Emergency Department: Payer: BC Managed Care – PPO

## 2021-01-26 ENCOUNTER — Emergency Department
Admission: EM | Admit: 2021-01-26 | Discharge: 2021-01-26 | Disposition: A | Discharge status: Home / Self Care | Payer: BC Managed Care – PPO | Attending: Student in an Organized Health Care Education/Training Program | Admitting: Student in an Organized Health Care Education/Training Program

## 2021-01-26 ENCOUNTER — Other Ambulatory Visit: Payer: Self-pay

## 2021-01-26 ENCOUNTER — Encounter: Payer: Self-pay | Admitting: Emergency Medicine

## 2021-01-26 DIAGNOSIS — I82452 Acute embolism and thrombosis of left peroneal vein: Secondary | ICD-10-CM | POA: Diagnosis not present

## 2021-01-26 DIAGNOSIS — F17210 Nicotine dependence, cigarettes, uncomplicated: Secondary | ICD-10-CM | POA: Insufficient documentation

## 2021-01-26 DIAGNOSIS — Z7901 Long term (current) use of anticoagulants: Secondary | ICD-10-CM | POA: Diagnosis not present

## 2021-01-26 DIAGNOSIS — R600 Localized edema: Secondary | ICD-10-CM | POA: Diagnosis present

## 2021-01-26 DIAGNOSIS — I82409 Acute embolism and thrombosis of unspecified deep veins of unspecified lower extremity: Secondary | ICD-10-CM

## 2021-01-26 HISTORY — DX: Other pulmonary embolism without acute cor pulmonale: I26.99

## 2021-01-26 HISTORY — DX: Acute embolism and thrombosis of unspecified deep veins of unspecified lower extremity: I82.409

## 2021-01-26 MED ORDER — APIXABAN 5 MG PO TABS: 5.0000 mg | ORAL_TABLET | Freq: Two times a day (BID) | ORAL | 0 refills | Status: DC

## 2021-01-26 NOTE — ED Provider Notes (Signed)
[facility] Emergency Department Provider Note  ____________________________________________   Event Date/Time   First MD Initiated Contact with Patient 01/26/21 1716     (approximate)  I have reviewed the triage vital signs and the nursing notes.   HISTORY  Chief Complaint Leg Swelling   HPI Maria Young is a 45 y.o. female presents to the ED with complaint of swelling to her left leg without history of injury.  Patient denies any recent traveling.  She reports that she has been off the Eliquis for 2 weeks for her previous DVT.  She was initially seen at [facility] and brought to the ED to rule out DVT of the left leg.  Currently she rates her pain as 6 out of 10.       Past Medical History:  Diagnosis Date  . DVT (deep venous thrombosis) ([place])   . PE (pulmonary thromboembolism) ([place])     There are no problems to display for this patient.   Past Surgical History:  Procedure Laterality Date  . ANKLE FRACTURE SURGERY Right    internal fixation  . ORIF WRIST FRACTURE Left 10/30/2019   Procedure: OPEN REDUCTION INTERNAL FIXATION (ORIF) WRIST FRACTURE with percutaneous screw;  Surgeon: Corky Mull, MD;  Location: [facility] ORS;  Service: Orthopedics;  Laterality: Left;    Prior to Admission medications   Medication Sig Start Date End Date Taking? Authorizing Provider  apixaban (ELIQUIS) 5 MG TABS tablet Take 1 tablet (5 mg total) by mouth 2 (two) times daily. 01/26/21 03/27/21  Johnn Hai, PA-C    Allergies Azithromycin  No family history on file.  Social History Social History   Tobacco Use  . Smoking status: Current Every Day Smoker    Packs/day: 0.50    Types: Cigarettes  . Smokeless tobacco: Never Used  Vaping Use  . Vaping Use: Never used  Substance Use Topics  . Alcohol use: Yes    Alcohol/week: 14.0 standard drinks    Types: 14 Glasses of wine per week  . Drug use: Yes    Types: Marijuana    Review of  Systems Constitutional: No fever/chills Cardiovascular: Denies chest pain. Respiratory: Denies shortness of breath. Gastrointestinal: No abdominal pain.  No nausea, no vomiting.  Genitourinary: Negative for dysuria. Musculoskeletal: Positive left lower extremity pain. Skin: Negative for rash. Neurological: Negative for headaches, focal weakness or numbness. ____________________________________________   PHYSICAL EXAM:  VITAL SIGNS: ED Triage Vitals  Enc Vitals Group     BP 01/26/21 1723 (!) 146/77     Pulse Rate 01/26/21 1723 66     Resp 01/26/21 1723 17     Temp 01/26/21 1723 98.8 F (37.1 C)     Temp Source 01/26/21 1723 Oral     SpO2 01/26/21 1723 100 %     Weight 01/26/21 1715 195 lb (88.5 kg)     Height 01/26/21 1715 6' 2"  (1.88 m)     Head Circumference --      Peak Flow --      Pain Score 01/26/21 1719 6     Pain Loc --      Pain Edu? --      Excl. in [place]? --     Constitutional: Alert and oriented. Well appearing and in no acute distress. Eyes: Conjunctivae are normal.  Head: Atraumatic. Neck: No stridor.   Cardiovascular: Normal rate, regular rhythm. Grossly normal heart sounds.  Good peripheral circulation. Respiratory: Normal respiratory effort.  No retractions. Lungs  CTAB. Gastrointestinal: Soft and nontender. No distention.  Musculoskeletal: No erythema or warmth is noted on examination of the left lower extremity.  There is tenderness on palpation of the calf area and a positive Homans' sign.  Pulses present.  No skin discoloration or abrasions are seen. Neurologic:  Normal speech and language. No gross focal neurologic deficits are appreciated.  Skin:  Skin is warm, dry and intact. No rash noted. Psychiatric: Mood and affect are normal. Speech and behavior are normal.  ____________________________________________   LABS (all labs ordered are listed, but only abnormal results are displayed)  Labs Reviewed - No data to display   RADIOLOGY I, Johnn Hai, personally viewed and evaluated these images (plain radiographs) as part of my medical decision making, as well as reviewing the written report by the radiologist.   Official radiology report(s): US Venous Img Lower Unilateral Left  Result Date: 01/26/2021 CLINICAL DATA:  Swelling and pain EXAM: LEFT LOWER EXTREMITY VENOUS DOPPLER ULTRASOUND TECHNIQUE: Gray-scale sonography with compression, as well as color and duplex ultrasound, were performed to evaluate the deep venous system(s) from the level of the common femoral vein through the popliteal and proximal calf veins. COMPARISON:  None. FINDINGS: VENOUS Normal compressibility of the common femoral, superficial femoral, and popliteal veins. There is occlusive thrombus within the left peroneal vein. There is no thrombus within the left posterior tibial vein. Visualized portions of profunda femoral vein and great saphenous vein unremarkable. No filling defects to suggest DVT on grayscale or color Doppler imaging. Doppler waveforms show normal direction of venous flow, normal respiratory plasticity and response to augmentation. Limited views of the contralateral common femoral vein are unremarkable. OTHER None. Limitations: none IMPRESSION: The study is positive for occlusive thrombus within the left peroneal vein. Electronically Signed   By: Constance Holster M.D.   On: 01/26/2021 18:44    ____________________________________________   PROCEDURES  Procedure(s) performed (including Critical Care):  Procedures   ____________________________________________   INITIAL IMPRESSION / ASSESSMENT AND PLAN / ED COURSE  As part of my medical decision making, I reviewed the following data within the electronic MEDICAL RECORD NUMBER Notes from prior ED visits and [place] Controlled Substance Database  45 year old female presents to the ED with complaint of left leg pain that started yesterday.  Patient states that she has a history of DVT and was started  on Eliquis last year.  Patient was taken off of Eliquis 2 weeks ago for blood test to be done on Monday.  Patient was brought over from [facility] today for evaluation of her left leg pain.  Physical exam is suspicious for DVT as she is moderately tender in the calf area.  Pulses are present both DP and PT.  Skin is intact.  Patient was made aware of the ultrasound findings.  We discussed restarting the Eliquis.  She has a trip planned which involves flying.  She was encouraged to call her doctor to talk about this and also let them know that she is back on Eliquis.  Patient reports that she has 5 tablets at home currently and a prescription was sent to her pharmacy. ____________________________________________   FINAL CLINICAL IMPRESSION(S) / ED DIAGNOSES  Final diagnoses:  Acute deep vein thrombosis (DVT) of left peroneal vein [facility])     ED Discharge Orders         Ordered    apixaban (ELIQUIS) 5 MG TABS tablet  2 times daily,   Status:  Discontinued  01/26/21 1902    apixaban (ELIQUIS) 5 MG TABS tablet  2 times daily        01/26/21 1906          *Please note:  Maria Young was evaluated in Emergency Department on 01/26/2021 for the symptoms described in the history of present illness. She was evaluated in the context of the global COVID-19 pandemic, which necessitated consideration that the patient might be at risk for infection with the SARS-CoV-2 virus that causes COVID-19. Institutional protocols and algorithms that pertain to the evaluation of patients at risk for COVID-19 are in a state of rapid change based on information released by regulatory bodies including the CDC and federal and state organizations. These policies and algorithms were followed during the patient's care in the ED.  Some ED evaluations and interventions may be delayed as a result of limited staffing during and the pandemic.*   Note:  This document was prepared using Dragon voice recognition software  and may include unintentional dictation errors.    Johnn Hai, PA-C 01/26/21 1911    Delman Kitten, MD 01/26/21 Sharilyn Sites

## 2021-01-26 NOTE — Discharge Instructions (Addendum)
Follow-up with your primary care provider to let them know that you are back on Eliquis.  The prescription was sent to your pharmacy.  You may take Tylenol with this medication if needed but do not take anti-inflammatories.  Also call Dr. Raul Del about your future travel plans.

## 2021-01-26 NOTE — ED Triage Notes (Signed)
Presents with pain and swelling to left leg  HX of blood clots in past and has been off her Elquis for 2 weeks  Brought over form [place]

## 2021-04-16 ENCOUNTER — Other Ambulatory Visit: Payer: Self-pay | Admitting: Specialist

## 2021-04-16 DIAGNOSIS — F17200 Nicotine dependence, unspecified, uncomplicated: Secondary | ICD-10-CM

## 2021-04-16 DIAGNOSIS — R911 Solitary pulmonary nodule: Secondary | ICD-10-CM

## 2021-06-29 ENCOUNTER — Ambulatory Visit: Payer: BC Managed Care – PPO

## 2021-07-09 ENCOUNTER — Ambulatory Visit
Admission: RE | Admit: 2021-07-09 | Discharge: 2021-07-09 | Disposition: A | Discharge status: Home / Self Care | Payer: BC Managed Care – PPO | Source: Ambulatory Visit | Attending: Specialist | Admitting: Specialist

## 2021-07-09 ENCOUNTER — Other Ambulatory Visit: Payer: Self-pay

## 2021-07-09 DIAGNOSIS — R911 Solitary pulmonary nodule: Secondary | ICD-10-CM | POA: Diagnosis not present

## 2021-07-09 DIAGNOSIS — F17200 Nicotine dependence, unspecified, uncomplicated: Secondary | ICD-10-CM | POA: Diagnosis present

## 2021-07-13 ENCOUNTER — Other Ambulatory Visit: Payer: Self-pay | Admitting: Specialist

## 2021-07-13 DIAGNOSIS — R918 Other nonspecific abnormal finding of lung field: Secondary | ICD-10-CM

## 2021-07-29 ENCOUNTER — Other Ambulatory Visit: Payer: Self-pay | Admitting: Internal Medicine

## 2021-07-29 DIAGNOSIS — M79606 Pain in leg, unspecified: Secondary | ICD-10-CM

## 2021-07-29 DIAGNOSIS — M7989 Other specified soft tissue disorders: Secondary | ICD-10-CM

## 2021-08-13 ENCOUNTER — Ambulatory Visit
Admission: RE | Admit: 2021-08-13 | Discharge: 2021-08-13 | Disposition: A | Discharge status: Home / Self Care | Payer: BC Managed Care – PPO | Source: Ambulatory Visit | Attending: Internal Medicine | Admitting: Internal Medicine

## 2021-08-13 ENCOUNTER — Other Ambulatory Visit: Payer: Self-pay

## 2021-08-13 DIAGNOSIS — M79606 Pain in leg, unspecified: Secondary | ICD-10-CM | POA: Diagnosis not present

## 2021-08-13 DIAGNOSIS — M7989 Other specified soft tissue disorders: Secondary | ICD-10-CM | POA: Insufficient documentation

## 2022-01-28 ENCOUNTER — Other Ambulatory Visit: Payer: Self-pay | Admitting: Internal Medicine

## 2022-01-28 DIAGNOSIS — Z1231 Encounter for screening mammogram for malignant neoplasm of breast: Secondary | ICD-10-CM

## 2022-03-11 ENCOUNTER — Ambulatory Visit
Admission: RE | Admit: 2022-03-11 | Discharge: 2022-03-11 | Disposition: A | Discharge status: Home / Self Care | Payer: BC Managed Care – PPO | Source: Ambulatory Visit | Attending: Internal Medicine | Admitting: Internal Medicine

## 2022-03-11 DIAGNOSIS — Z1231 Encounter for screening mammogram for malignant neoplasm of breast: Secondary | ICD-10-CM | POA: Diagnosis not present

## 2022-03-17 ENCOUNTER — Other Ambulatory Visit: Payer: Self-pay | Admitting: Internal Medicine

## 2022-03-17 DIAGNOSIS — N6489 Other specified disorders of breast: Secondary | ICD-10-CM

## 2022-03-17 DIAGNOSIS — R928 Other abnormal and inconclusive findings on diagnostic imaging of breast: Secondary | ICD-10-CM

## 2022-03-20 LAB — COLOGUARD: COLOGUARD: NEGATIVE

## 2022-04-04 ENCOUNTER — Ambulatory Visit
Admission: RE | Admit: 2022-04-04 | Discharge: 2022-04-04 | Disposition: A | Discharge status: Home / Self Care | Payer: BC Managed Care – PPO | Source: Ambulatory Visit | Attending: Internal Medicine | Admitting: Internal Medicine

## 2022-04-04 DIAGNOSIS — R928 Other abnormal and inconclusive findings on diagnostic imaging of breast: Secondary | ICD-10-CM

## 2022-04-04 DIAGNOSIS — N6489 Other specified disorders of breast: Secondary | ICD-10-CM | POA: Diagnosis present

## 2022-04-06 ENCOUNTER — Other Ambulatory Visit: Payer: Self-pay | Admitting: Internal Medicine

## 2022-04-06 DIAGNOSIS — N63 Unspecified lump in unspecified breast: Secondary | ICD-10-CM

## 2022-04-06 DIAGNOSIS — R928 Other abnormal and inconclusive findings on diagnostic imaging of breast: Secondary | ICD-10-CM

## 2022-04-13 ENCOUNTER — Ambulatory Visit
Admission: RE | Admit: 2022-04-13 | Discharge: 2022-04-13 | Disposition: A | Discharge status: Home / Self Care | Payer: BC Managed Care – PPO | Source: Ambulatory Visit | Attending: Internal Medicine | Admitting: Internal Medicine

## 2022-04-13 DIAGNOSIS — R928 Other abnormal and inconclusive findings on diagnostic imaging of breast: Secondary | ICD-10-CM | POA: Insufficient documentation

## 2022-04-13 DIAGNOSIS — C50919 Malignant neoplasm of unspecified site of unspecified female breast: Secondary | ICD-10-CM

## 2022-04-13 DIAGNOSIS — N63 Unspecified lump in unspecified breast: Secondary | ICD-10-CM | POA: Insufficient documentation

## 2022-04-13 HISTORY — PX: BREAST BIOPSY: SHX20

## 2022-04-13 HISTORY — DX: Malignant neoplasm of unspecified site of unspecified female breast: C50.919

## 2022-04-14 ENCOUNTER — Encounter: Payer: Self-pay | Admitting: *Deleted

## 2022-04-14 ENCOUNTER — Other Ambulatory Visit: Payer: Self-pay | Admitting: *Deleted

## 2022-04-14 DIAGNOSIS — C50919 Malignant neoplasm of unspecified site of unspecified female breast: Secondary | ICD-10-CM

## 2022-04-14 LAB — SURGICAL PATHOLOGY

## 2022-04-14 NOTE — Progress Notes (Signed)
Received referral for newly diagnosed breast cancer from [facility] Radiology.  Navigation initiated.  Med Onc and Surgical consults scheduled. Dr. Lysle Pearl 04/18/22 and Dr. Grayland Ormond 04/19/22.

## 2022-04-17 DIAGNOSIS — Z170 Estrogen receptor positive status [ER+]: Secondary | ICD-10-CM | POA: Insufficient documentation

## 2022-04-17 NOTE — Progress Notes (Unsigned)
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  INTERVAL HISTORY: Patient is a 46 year old female who recently underwent her initial screening mammogram.  She was noted to have an abnormality in imaging which led to ultrasound and biopsy confirming the above-stated malignancy.  She currently feels well and is asymptomatic.  She has no neurologic complaints.  She denies any recent fevers or illnesses.  She has a good appetite and denies weight loss.  She has no chest pain, shortness of breath, cough, or hemoptysis.  She denies any nausea, vomiting, constipation, or diarrhea.  She has no urinary complaints.  Patient feels at her baseline and offers no specific complaints today.  REVIEW OF SYSTEMS:   Review of Systems  Constitutional: Negative.  Negative for fever, malaise/fatigue and weight loss.  Respiratory: Negative.  Negative for cough, hemoptysis and shortness of breath.   Cardiovascular: Negative.  Negative for chest pain and leg swelling.  Gastrointestinal: Negative.  Negative for abdominal pain.  Genitourinary: Negative.  Negative for dysuria.  Musculoskeletal: Negative.  Negative for back pain.  Skin: Negative.  Negative for rash.  Neurological: Negative.  Negative for dizziness, focal weakness, weakness and headaches.  Psychiatric/Behavioral: Negative.  The patient is not nervous/anxious.    As per HPI. Otherwise, a complete review of systems is negative.  PAST MEDICAL HISTORY: Past Medical History:  Diagnosis Date   DVT (deep venous thrombosis) ([facility])    Heart murmur    PE (pulmonary thromboembolism) ([place])     PAST  SURGICAL HISTORY: Past Surgical History:  Procedure Laterality Date   ANKLE FRACTURE SURGERY Right    internal fixation   BREAST BIOPSY Left 04/13/2022   [place] Core bx coil clip-path pending   ORIF WRIST FRACTURE Left 10/30/2019   Procedure: OPEN REDUCTION INTERNAL FIXATION (ORIF) WRIST FRACTURE with percutaneous screw;  Surgeon: Corky Mull, MD;  Location: [facility] ORS;  Service: Orthopedics;  Laterality: Left;    FAMILY HISTORY: Family History  Problem Relation Age of Onset   Diabetes Father    Heart disease Father    Hyperlipidemia Brother    Cancer Paternal Grandfather     ADVANCED DIRECTIVES (Y/N):  N  HEALTH MAINTENANCE: Social History   Tobacco Use   Smoking status: Every Day    Packs/day: 0.50    Types: Cigarettes   Smokeless tobacco: Never  Vaping Use   Vaping Use: Never used  Substance Use Topics   Alcohol use: Yes    Alcohol/week: 14.0 standard drinks    Types: 14 Glasses of wine per week   Drug use: Yes    Types: Marijuana     Colonoscopy:  PAP:  Bone density:  Lipid panel:  Allergies  Allergen Reactions   Azithromycin Hives and Itching    Current Outpatient Medications  Medication Sig Dispense Refill   apixaban (ELIQUIS) 5 MG TABS tablet Take 1 tablet (5 mg total) by mouth 2 (two) times daily. 120 tablet 0   amLODipine (NORVASC) 2.5 MG tablet Take by mouth. (Patient not taking: Reported on 04/19/2022)     No current facility-administered medications for this visit.    OBJECTIVE: Vitals:   04/19/22 1353  BP: (!) 145/95  Pulse: 66  Resp: 16  Temp: (!) 97.4 F (36.3 C)  SpO2: 100%     Body mass index is 25.42 kg/m.    ECOG FS:0 - Asymptomatic  General: Well-developed, well-nourished, no acute distress. Eyes: Pink conjunctiva, anicteric sclera. HEENT: Normocephalic, moist mucous membranes. Breast: Exam recently performed by another provider. Lungs: No audible wheezing or coughing. Heart: Regular rate and rhythm. Abdomen: Soft, nontender,  no obvious distention. Musculoskeletal: No edema, cyanosis, or clubbing. Neuro: Alert, answering all questions appropriately. Cranial nerves grossly intact. Skin: No rashes or petechiae noted. Psych: Normal affect. Lymphatics: No cervical, calvicular, axillary or inguinal LAD.   LAB RESULTS:  Lab Results  Component Value Date   NA 137 06/11/2020   K 3.7 06/11/2020   CL 107 06/11/2020   CO2 23 06/11/2020   GLUCOSE 109 (H) 06/11/2020   BUN 10 06/11/2020   CREATININE 0.72 06/11/2020   CALCIUM 9.2 06/11/2020   GFRNONAA >60 06/11/2020   GFRAA >60 06/11/2020    Lab Results  Component Value Date   WBC 6.5 06/11/2020   HGB 13.9 06/11/2020   HCT 39.8 06/11/2020   MCV 97.8 06/11/2020   PLT 235 06/11/2020     STUDIES: US BREAST LTD UNI LEFT INC AXILLA  Result Date: 04/04/2022 CLINICAL DATA:  Patient was recalled from screening mammogram for a possible mass in the left breast. EXAM: DIGITAL DIAGNOSTIC UNILATERAL LEFT MAMMOGRAM WITH TOMOSYNTHESIS AND CAD; ULTRASOUND LEFT BREAST LIMITED TECHNIQUE: Left digital diagnostic mammography and breast tomosynthesis was performed. The images were evaluated with computer-aided detection.; Targeted ultrasound examination of the left breast was performed. COMPARISON:  Baseline screening mammogram dated 03/11/2022. ACR Breast Density Category c: The breast tissue is heterogeneously dense, which may obscure small masses. FINDINGS: Additional imaging of the left breast was performed. There is persistence of an 8 mm mass in the posterior third of the lateral aspect of the breast best seen on the cc view. Targeted ultrasound is performed, showing a hypoechoic mass with indistinct margins measuring 8 x 6 x 7 mm. Sonographic evaluation of the left axilla does not show any enlarged adenopathy. IMPRESSION: Indeterminate 8 mm mass in the 2 o'clock region of the left breast. RECOMMENDATION: Ultrasound-guided core biopsy of the mass in the 2 o'clock region of the left  breast is recommended. I have discussed the findings and recommendations with the patient. If applicable, a reminder letter will be sent to the patient regarding the next appointment. BI-RADS CATEGORY  4: Suspicious. Electronically Signed   By: Lillia Mountain M.D.   On: 04/04/2022 11:07  MM DIAG BREAST TOMO UNI LEFT  Result Date: 04/04/2022 CLINICAL DATA:  Patient was recalled from screening mammogram for a possible mass in the left breast. EXAM: DIGITAL DIAGNOSTIC UNILATERAL LEFT MAMMOGRAM WITH TOMOSYNTHESIS AND CAD; ULTRASOUND LEFT BREAST LIMITED TECHNIQUE: Left digital diagnostic mammography and breast tomosynthesis was performed. The images were evaluated with computer-aided detection.; Targeted ultrasound examination of the left breast was performed. COMPARISON:  Baseline screening mammogram dated 03/11/2022. ACR Breast Density Category c: The breast tissue is heterogeneously dense, which may obscure small masses. FINDINGS: Additional imaging of the left breast was performed. There is persistence of an 8 mm mass in the posterior third of the lateral aspect of the breast best seen on the cc view. Targeted ultrasound is performed, showing a hypoechoic mass with indistinct margins measuring 8 x 6 x 7 mm. Sonographic evaluation of the left axilla does not show any enlarged adenopathy. IMPRESSION: Indeterminate 8 mm mass  in the 2 o'clock region of the left breast. RECOMMENDATION: Ultrasound-guided core biopsy of the mass in the 2 o'clock region of the left breast is recommended. I have discussed the findings and recommendations with the patient. If applicable, a reminder letter will be sent to the patient regarding the next appointment. BI-RADS CATEGORY  4: Suspicious. Electronically Signed   By: Lillia Mountain M.D.   On: 04/04/2022 11:07  MM CLIP PLACEMENT LEFT  Result Date: 04/13/2022 CLINICAL DATA:  Evaluate biopsy marker EXAM: 3D DIAGNOSTIC LEFT MAMMOGRAM POST ULTRASOUND BIOPSY COMPARISON:  Previous exam(s).  FINDINGS: 3D Mammographic images were obtained following ultrasound guided biopsy of a left breast mass. The biopsy marking clip is in expected position at the site of biopsy. IMPRESSION: Appropriate positioning of the coil shaped biopsy marking clip at the site of biopsy in the biopsied left breast mass. Final Assessment: Post Procedure Mammograms for Marker Placement Electronically Signed   By: Dorise Bullion III M.D.   On: 04/13/2022 10:07  [place] LT BREAST BX W LOC DEV 1ST LESION IMG BX SPEC US GUIDE  Addendum Date: 04/19/2022   ADDENDUM REPORT: 04/19/2022 14:39 ADDENDUM: Pathology revealed GRADE 1 INVASIVE MAMMARY CARCINOMA, WITH MUCINOUS FEATURES of the LEFT breast, 2 o'clock (coil clip). This was found to be concordant by Dr. Dorise Bullion. Pathology results were discussed with the patient by telephone. The patient reported doing well after the biopsy with tenderness at the site. Post biopsy instructions and care were reviewed and questions were answered. The patient was encouraged to call [facility] for any additional concerns. A surgical referral will be arranged by Casper Harrison RN Oncology Navigator of [place]. Pathology results reported by Stacie Acres RN on 04/14/2022. Electronically Signed   By: Dorise Bullion III M.D.   On: 04/19/2022 14:39   Result Date: 04/19/2022 CLINICAL DATA:  Biopsy of a 2 o'clock left breast mass EXAM: ULTRASOUND GUIDED LEFT BREAST CORE NEEDLE BIOPSY COMPARISON:  None Available. PROCEDURE: I met with the patient and we discussed the procedure of ultrasound-guided biopsy, including benefits and alternatives. We discussed the high likelihood of a successful procedure. We discussed the risks of the procedure, including infection, bleeding, tissue injury, clip migration, and inadequate sampling. Informed written consent was given. The usual time-out protocol was performed immediately prior to  the procedure. Lesion quadrant: 2 o'clock left breast mass Using sterile technique and 1% Lidocaine as local anesthetic, under direct ultrasound visualization, a 12 gauge spring-loaded device was used to perform biopsy of a 2 o'clock left breast mass using a lateral approach. At the conclusion of the procedure a tissue marker clip was deployed into the biopsy cavity. Follow up 2 view mammogram was performed and dictated separately. IMPRESSION: Ultrasound guided biopsy of a 2 o'clock left breast mass. No apparent complications. Electronically Signed: By: Dorise Bullion III M.D. 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  It is unlikely she will require adjuvant chemotherapy, but will send Oncotype to confirm.  Patient will definitely benefit from adjuvant XRT and referral has been sent to radiation oncology.  Finally, given the ER/PR  status of her malignancy she will benefit from tamoxifen for 5 years.  Return to clinic on May 12, 2022 for further evaluation, discussion of her final pathology results, and additional treatment planning. Iron deficiency anemia: Patient states this is longstanding secondary to heavy menses.  She cannot tolerate oral iron supplementation.  We will further evaluate once treatment for breast cancer is complete. 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  Her first blood clot was noted on CT of the chest on June 11, 2020 which she states coincided with COVID-vaccine.  Patient was off treatment for several weeks and by report had a DVT on January 26, 2021. Unclear what work-up has been done up to this point.  Patient will likely require  full work-up in the future. Genetics: Given patient's age, she was also given a genetics referral.  I spent a total of 60 minutes reviewing chart data, face-to-face evaluation with the patient, counseling and coordination of care as detailed above.   Patient expressed understanding and was in agreement with this plan. She also understands that She can call clinic at any time with any questions, concerns, or complaints.    Cancer Staging  Carcinoma of upper-outer quadrant of left breast in female, estrogen receptor positive ([place]) Staging form: Breast, AJCC 8th Edition - Clinical stage from 04/17/2022: Stage IA (cT1b, cN0, cM0, G1, ER+, PR+, HER2-) - Signed by Lloyd Huger, MD on 04/17/2022 Stage prefix: Initial diagnosis Histologic grading system: 3 grade system  Lloyd Huger, MD   04/20/2022 6:39 AM

## 2022-04-18 ENCOUNTER — Ambulatory Visit: Payer: Self-pay | Admitting: Surgery

## 2022-04-18 NOTE — H&P (View-Only) (Signed)
Subjective:  CC: Malignant neoplasm of upper-outer quadrant of left breast in female, estrogen receptor positive (CMS-HCC) [C50.412, Z17.0] HPI:  Maria Young is a 46 y.o. female who was referred by Gladstone Lighter, MD for evaluation of above. Change was noted on last screening mammogram. Patient does not routinely do self breast exams. Age of menarche was 21. Patient denies hormonal therapy. Patient is G1P1. Age of first live birth was 87. Patient did not breast feed. Patient denies nipple discharge. Patient denies previous breast biopsy. Patient denies a personal history of breast cancer.   Past Medical History:  has a past medical history of H/O blood clots, Heart murmur, unspecified, Menorrhagia with irregular cycle, Pneumonia, Tobacco use, and Viral meningitis.  Past Surgical History:  has a past surgical history that includes Percutaneous screw fixation of scaphoid fracture, left wrist. (Left, 10/30/2019) and Fracture surgery (2005).  Family History: family history includes Alcohol abuse in her mother; Diabetes type II in her father; Heart disease in her father; Hyperlipidemia (Elevated cholesterol) in her brother; Liver disease in her mother; Obesity in her father; Stroke in her paternal grandmother.paternal grandfather with prostate cancer  Social History:  reports that she has been smoking cigarettes. She has a 15.00 pack-year smoking history. She has never used smokeless tobacco. She reports current alcohol use. She reports that she does not currently use drugs.  Current Medications: has a current medication list which includes the following prescription(s): apixaban and amlodipine.  Allergies:  Allergies as of 04/18/2022 - Reviewed 04/18/2022 Allergen Reaction Noted  Azithromycin Hives 05/08/2014   ROS:  A 15 point review of systems was performed and was negative except as noted in HPI   Objective:    BP (!) 160/88   Pulse 68   Ht 188 cm (6' 2" ) Comment: Per chart\  Wt  91.2 kg (201 lb) Comment: Per chart  BMI 25.81 kg/m   Constitutional :  No distress, cooperative, alert Lymphatics/Throat:  Supple with no lymphadenopathy Respiratory:  Clear to auscultation bilaterally Cardiovascular:  Regular rate and rhythm Gastrointestinal: Soft, non-tender, non-distended, no organomegaly. Musculoskeletal: Steady gait and movement Skin: Cool and moist, surgical scars Psychiatric: Normal affect, non-agitated, not confused Breast: Normal appearance and no palpable abnormality in bilateral breasts and axilla, except for minor bruising on left at biopsy site. Chaperone present for exam.     LABS:  Reason for Addendum #1:  Breast Biomarker Results   Specimen Submitted:  A. Breast, left   Clinical History: Mass. Coil-shaped clip placed following ultrasound  guided biopsy of LEFT breast at 2 o'clock.     DIAGNOSIS:  A. BREAST, LEFT AT 2:00, 6 CM FROM THE NIPPLE; ULTRASOUND-GUIDED CORE  NEEDLE BIOPSY:  - INVASIVE MAMMARY CARCINOMA, WITH MUCINOUS FEATURES.   Size of invasive carcinoma: 6 mm in this sample  Histologic grade of invasive carcinoma: Grade 1                       Glandular/tubular differentiation score: 3                       Nuclear pleomorphism score: 1                       Mitotic rate score: 1                       Total score: 5  Ductal carcinoma in situ: Not identified  Lymphovascular invasion:  Not identified   ER/PR/HER2: Immunohistochemistry will be performed on block A1, with  reflex to [place] for HER2 2+. The results will be reported in an addendum.   GROSS DESCRIPTION:  A. Labeled: Left breast biopsy 2:00 6 cm from nipple  Received: Formalin  Time/date in fixative: Collected and placed in formalin at 9:33 AM on  04/13/2022  Cold ischemic time: Less than 1 minute  Total fixation time: Approximately 7.75 hours  Core pieces: 3 cores and multiple additional fragments  Size: Range from 0.7-1.5 cm in length and range from 0.2-0.5 cm in   diameter  Description: Received are cores and fragments of yellow fibrofatty  tissue and pink gelatinous soft tissue.  The additional fragments are  1.3 x 0.5 x 0.2 cm in aggregate.  Ink color: Blue  Entirely submitted in cassettes 1-2 with 3 cores in cassette 1 and the  remaining fragments in cassette 2.   RB 04/13/2022   Final Diagnosis performed by Allena Napoleon, MD.   Electronically signed  04/14/2022 10:12:05AM  The electronic signature indicates that the named Attending Pathologist  has evaluated the specimen  Technical component performed at [facility], [address] [place],  [place] [address] Lab: [phone] Dir: Rush Farmer, MD, MMM   Professional component performed at [facility], [facility], [place], [place], [place] [address] Lab: [phone]  Dir: Kathi Simpers, MD   ADDENDUM:  CASE SUMMARY: BREAST BIOMARKER TESTS  Estrogen Receptor (ER) Status: POSITIVE          Percentage of cells with nuclear positivity: Greater than 90%          Average intensity of staining: Strong   Progesterone Receptor (PgR) Status: POSITIVE          Percentage of cells with nuclear positivity: 81-90%          Average intensity of staining: Strong   HER2 (by immunohistochemistry): NEGATIVE (Score 1+)  Ki-67: Not performed   Cold Ischemia and Fixation Times: Meet requirements specified in latest  version of the ASCO/CAP guidelines  Testing Performed on Block Number(s): A1   METHODS  Fixative: Formalin  Estrogen Receptor:  FDA cleared ([place]) Primary Antibody:  SP1  Progesterone Receptor: FDA cleared ([place]) Primary Antibody: 1E2  HER2 (by IHC): FDA approved ([place]) Primary Antibody: 4B5 (PATHWAY)  Immunohistochemistry controls worked appropriately. Slides were prepared  by Launa Grill, [place], and interpreted by Allena Napoleon, MD.  (v1.5.0.1)     Addendum #1 performed by Allena Napoleon, MD.   Electronically signed  04/14/2022 4:50:21PM  The  electronic signature indicates that the named Attending Pathologist  has evaluated the specimen  Technical component performed at [facility], [address], [place],  [place] [address] Lab: [phone] Dir: Rush Farmer, MD, MMM   Professional component performed at [facility], [facility], [place], [place],  [address] Lab: [phone]  Dir: Kathi Simpers, MD   RADS: CLINICAL DATA:  Patient was recalled from screening mammogram for a  possible mass in the left breast.   EXAM:  DIGITAL DIAGNOSTIC UNILATERAL LEFT MAMMOGRAM WITH TOMOSYNTHESIS AND  CAD; ULTRASOUND LEFT BREAST LIMITED   TECHNIQUE:  Left digital diagnostic mammography and breast tomosynthesis was  performed. The images were evaluated with computer-aided detection.;  Targeted ultrasound examination of the left breast was performed.   COMPARISON:  Baseline screening mammogram dated 03/11/2022.   ACR Breast Density Category c: The breast tissue is heterogeneously  dense, which may obscure small masses.   FINDINGS:  Additional imaging of the left breast was performed. There is  persistence of an 8 mm mass in the posterior third of the lateral  aspect of the breast best seen on the cc view.   Targeted ultrasound is performed, showing a hypoechoic mass with  indistinct margins measuring 8 x 6 x 7 mm. Sonographic evaluation of  the left axilla does not show any enlarged adenopathy.   IMPRESSION:  Indeterminate 8 mm mass in the 2 o'clock region of the left breast.   RECOMMENDATION:  Ultrasound-guided core biopsy of the mass in the 2 o'clock region of  the left breast is recommended.   I have discussed the findings and recommendations with the patient.  If applicable, a reminder letter will be sent to the patient  regarding the next appointment.   BI-RADS CATEGORY  4: Suspicious.     Assessment:  Malignant neoplasm of upper-outer quadrant of left breast in female, estrogen receptor  positive (CMS-HCC) [C50.412, Z17.0]  eliquis use for hx of PE   Plan:    1. Malignant neoplasm of upper-outer quadrant of left breast in female, estrogen receptor positive (CMS-HCC) [C50.412, Z17.0]  Discussed the risk of surgery including recurrence, chronic pain, post-op infxn, poor/delayed wound healing, poor cosmesis, seroma, hematoma formation, and possible re-operation to address said risks. The risks of general anesthetic, if used, includes MI, CVA, sudden death or even reaction to anesthetic medications also discussed.  Typical post-op recovery time and possbility of activity restrictions were also discussed.  Alternatives include continued observation.  Benefits include possible symptom relief, pathologic evaluation, and/or curative excision.   The patient verbalized understanding and all questions were answered to the patient's satisfaction.  2. Patient has elected to proceed with surgical treatment. Procedure will be scheduled. LEFT SLNB with RF tag placement.  Will discuss final incision plans again preop.    Pt also meets criteria for genetic testing due to age and paternal grandfather with history of prostate CA.  Will ask oncology to coordinate.  She has recurrent hx of DVT, PE.  Second episode developing while off eliquis for only two weeks.  Recommended filter placement but patient not comfortable, so will proceed with lovenox bridge to minimize time off anticoagulation.  Will obtain approval through Dr. Joanell Rising office prior to procedure.  labs/images/medications/previous chart entries reviewed personally and relevant changes/updates noted above.

## 2022-04-18 NOTE — H&P (Signed)
Subjective:  CC: Malignant neoplasm of upper-outer quadrant of left breast in female, estrogen receptor positive (CMS-HCC) [C50.412, Z17.0] HPI:  Maria Young is a 46 y.o. female who was referred by Gladstone Lighter, MD for evaluation of above. Change was noted on last screening mammogram. Patient does not routinely do self breast exams. Age of menarche was 89. Patient denies hormonal therapy. Patient is G1P1. Age of first live birth was 13. Patient did not breast feed. Patient denies nipple discharge. Patient denies previous breast biopsy. Patient denies a personal history of breast cancer.   Past Medical History:  has a past medical history of H/O blood clots, Heart murmur, unspecified, Menorrhagia with irregular cycle, Pneumonia, Tobacco use, and Viral meningitis.  Past Surgical History:  has a past surgical history that includes Percutaneous screw fixation of scaphoid fracture, left wrist. (Left, 10/30/2019) and Fracture surgery (2005).  Family History: family history includes Alcohol abuse in her mother; Diabetes type II in her father; Heart disease in her father; Hyperlipidemia (Elevated cholesterol) in her brother; Liver disease in her mother; Obesity in her father; Stroke in her paternal grandmother.paternal grandfather with prostate cancer  Social History:  reports that she has been smoking cigarettes. She has a 15.00 pack-year smoking history. She has never used smokeless tobacco. She reports current alcohol use. She reports that she does not currently use drugs.  Current Medications: has a current medication list which includes the following prescription(s): apixaban and amlodipine.  Allergies:  Allergies as of 04/18/2022 - Reviewed 04/18/2022 Allergen Reaction Noted  Azithromycin Hives 05/08/2014   ROS:  A 15 point review of systems was performed and was negative except as noted in HPI   Objective:    BP (!) 160/88   Pulse 68   Ht 188 cm (6' 2" ) Comment: Per chart\  Wt  91.2 kg (201 lb) Comment: Per chart  BMI 25.81 kg/m   Constitutional :  No distress, cooperative, alert Lymphatics/Throat:  Supple with no lymphadenopathy Respiratory:  Clear to auscultation bilaterally Cardiovascular:  Regular rate and rhythm Gastrointestinal: Soft, non-tender, non-distended, no organomegaly. Musculoskeletal: Steady gait and movement Skin: Cool and moist, surgical scars Psychiatric: Normal affect, non-agitated, not confused Breast: Normal appearance and no palpable abnormality in bilateral breasts and axilla, except for minor bruising on left at biopsy site. Chaperone present for exam.     LABS:  Reason for Addendum #1:  Breast Biomarker Results   Specimen Submitted:  A. Breast, left   Clinical History: Mass. Coil-shaped clip placed following ultrasound  guided biopsy of LEFT breast at 2 o'clock.     DIAGNOSIS:  A. BREAST, LEFT AT 2:00, 6 CM FROM THE NIPPLE; ULTRASOUND-GUIDED CORE  NEEDLE BIOPSY:  - INVASIVE MAMMARY CARCINOMA, WITH MUCINOUS FEATURES.   Size of invasive carcinoma: 6 mm in this sample  Histologic grade of invasive carcinoma: Grade 1                       Glandular/tubular differentiation score: 3                       Nuclear pleomorphism score: 1                       Mitotic rate score: 1                       Total score: 5  Ductal carcinoma in situ: Not identified  Lymphovascular invasion:  Not identified   ER/PR/HER2: Immunohistochemistry will be performed on block A1, with  reflex to [place] for HER2 2+. The results will be reported in an addendum.   GROSS DESCRIPTION:  A. Labeled: Left breast biopsy 2:00 6 cm from nipple  Received: Formalin  Time/date in fixative: Collected and placed in formalin at 9:33 AM on  04/13/2022  Cold ischemic time: Less than 1 minute  Total fixation time: Approximately 7.75 hours  Core pieces: 3 cores and multiple additional fragments  Size: Range from 0.7-1.5 cm in length and range from 0.2-0.5 cm in   diameter  Description: Received are cores and fragments of yellow fibrofatty  tissue and pink gelatinous soft tissue.  The additional fragments are  1.3 x 0.5 x 0.2 cm in aggregate.  Ink color: Blue  Entirely submitted in cassettes 1-2 with 3 cores in cassette 1 and the  remaining fragments in cassette 2.   RB 04/13/2022   Final Diagnosis performed by Allena Napoleon, MD.   Electronically signed  04/14/2022 10:12:05AM  The electronic signature indicates that the named Attending Pathologist  has evaluated the specimen  Technical component performed at [facility], [address] [place],  [place] [address] Lab: [phone] Dir: Rush Farmer, MD, MMM   Professional component performed at [facility], [facility], [place], [place], [place] [address] Lab: [phone]  Dir: Kathi Simpers, MD   ADDENDUM:  CASE SUMMARY: BREAST BIOMARKER TESTS  Estrogen Receptor (ER) Status: POSITIVE          Percentage of cells with nuclear positivity: Greater than 90%          Average intensity of staining: Strong   Progesterone Receptor (PgR) Status: POSITIVE          Percentage of cells with nuclear positivity: 81-90%          Average intensity of staining: Strong   HER2 (by immunohistochemistry): NEGATIVE (Score 1+)  Ki-67: Not performed   Cold Ischemia and Fixation Times: Meet requirements specified in latest  version of the ASCO/CAP guidelines  Testing Performed on Block Number(s): A1   METHODS  Fixative: Formalin  Estrogen Receptor:  FDA cleared ([place]) Primary Antibody:  SP1  Progesterone Receptor: FDA cleared ([place]) Primary Antibody: 1E2  HER2 (by IHC): FDA approved ([place]) Primary Antibody: 4B5 (PATHWAY)  Immunohistochemistry controls worked appropriately. Slides were prepared  by Launa Grill, [place], and interpreted by Allena Napoleon, MD.  (v1.5.0.1)     Addendum #1 performed by Allena Napoleon, MD.   Electronically signed  04/14/2022 4:50:21PM  The  electronic signature indicates that the named Attending Pathologist  has evaluated the specimen  Technical component performed at [facility], [address], [place],  [place] [address] Lab: [phone] Dir: Rush Farmer, MD, MMM   Professional component performed at [facility], [facility], [place], [place], [place] [address] Lab: [phone]  Dir: Kathi Simpers, MD   RADS: CLINICAL DATA:  Patient was recalled from screening mammogram for a  possible mass in the left breast.   EXAM:  DIGITAL DIAGNOSTIC UNILATERAL LEFT MAMMOGRAM WITH TOMOSYNTHESIS AND  CAD; ULTRASOUND LEFT BREAST LIMITED   TECHNIQUE:  Left digital diagnostic mammography and breast tomosynthesis was  performed. The images were evaluated with computer-aided detection.;  Targeted ultrasound examination of the left breast was performed.   COMPARISON:  Baseline screening mammogram dated 03/11/2022.   ACR Breast Density Category c: The breast tissue is heterogeneously  dense, which may obscure small masses.   FINDINGS:  Additional imaging of the left breast was performed. There is  persistence of an 8 mm mass in the posterior third of the lateral  aspect of the breast best seen on the cc view.   Targeted ultrasound is performed, showing a hypoechoic mass with  indistinct margins measuring 8 x 6 x 7 mm. Sonographic evaluation of  the left axilla does not show any enlarged adenopathy.   IMPRESSION:  Indeterminate 8 mm mass in the 2 o'clock region of the left breast.   RECOMMENDATION:  Ultrasound-guided core biopsy of the mass in the 2 o'clock region of  the left breast is recommended.   I have discussed the findings and recommendations with the patient.  If applicable, a reminder letter will be sent to the patient  regarding the next appointment.   BI-RADS CATEGORY  4: Suspicious.     Assessment:  Malignant neoplasm of upper-outer quadrant of left breast in female, estrogen receptor  positive (CMS-HCC) [C50.412, Z17.0]  eliquis use for hx of PE   Plan:    1. Malignant neoplasm of upper-outer quadrant of left breast in female, estrogen receptor positive (CMS-HCC) [C50.412, Z17.0]  Discussed the risk of surgery including recurrence, chronic pain, post-op infxn, poor/delayed wound healing, poor cosmesis, seroma, hematoma formation, and possible re-operation to address said risks. The risks of general anesthetic, if used, includes MI, CVA, sudden death or even reaction to anesthetic medications also discussed.  Typical post-op recovery time and possbility of activity restrictions were also discussed.  Alternatives include continued observation.  Benefits include possible symptom relief, pathologic evaluation, and/or curative excision.   The patient verbalized understanding and all questions were answered to the patient's satisfaction.  2. Patient has elected to proceed with surgical treatment. Procedure will be scheduled. LEFT SLNB with RF tag placement.  Will discuss final incision plans again preop.    Pt also meets criteria for genetic testing due to age and paternal grandfather with history of prostate CA.  Will ask oncology to coordinate.  She has recurrent hx of DVT, PE.  Second episode developing while off eliquis for only two weeks.  Recommended filter placement but patient not comfortable, so will proceed with lovenox bridge to minimize time off anticoagulation.  Will obtain approval through Dr. Joanell Rising office prior to procedure.  labs/images/medications/previous chart entries reviewed personally and relevant changes/updates noted above.

## 2022-04-19 ENCOUNTER — Encounter: Payer: Self-pay | Admitting: *Deleted

## 2022-04-19 ENCOUNTER — Inpatient Hospital Stay: Payer: BC Managed Care – PPO | Attending: Oncology | Admitting: Oncology

## 2022-04-19 ENCOUNTER — Other Ambulatory Visit: Payer: Self-pay | Admitting: Surgery

## 2022-04-19 ENCOUNTER — Inpatient Hospital Stay: Payer: BC Managed Care – PPO

## 2022-04-19 ENCOUNTER — Encounter: Payer: Self-pay | Admitting: Oncology

## 2022-04-19 DIAGNOSIS — Z86711 Personal history of pulmonary embolism: Secondary | ICD-10-CM | POA: Insufficient documentation

## 2022-04-19 DIAGNOSIS — D509 Iron deficiency anemia, unspecified: Secondary | ICD-10-CM | POA: Diagnosis not present

## 2022-04-19 DIAGNOSIS — F17210 Nicotine dependence, cigarettes, uncomplicated: Secondary | ICD-10-CM | POA: Diagnosis not present

## 2022-04-19 DIAGNOSIS — Z86718 Personal history of other venous thrombosis and embolism: Secondary | ICD-10-CM | POA: Diagnosis not present

## 2022-04-19 DIAGNOSIS — C50412 Malignant neoplasm of upper-outer quadrant of left female breast: Secondary | ICD-10-CM

## 2022-04-19 DIAGNOSIS — Z170 Estrogen receptor positive status [ER+]: Secondary | ICD-10-CM | POA: Insufficient documentation

## 2022-04-19 DIAGNOSIS — Z7901 Long term (current) use of anticoagulants: Secondary | ICD-10-CM | POA: Diagnosis not present

## 2022-04-19 NOTE — Progress Notes (Signed)
Accompanied patient and family to initial medical oncology appointment.   Reviewed Breast Cancer treatment handbook.   Care plan summary given to patient.   Reviewed outreach programs and cancer center services.

## 2022-04-21 ENCOUNTER — Encounter
Admission: RE | Admit: 2022-04-21 | Discharge: 2022-04-21 | Disposition: A | Discharge status: Home / Self Care | Payer: BC Managed Care – PPO | Source: Ambulatory Visit | Attending: Surgery | Admitting: Surgery

## 2022-04-21 VITALS — Ht 74.0 in | Wt 198.0 lb

## 2022-04-21 DIAGNOSIS — Z01812 Encounter for preprocedural laboratory examination: Secondary | ICD-10-CM

## 2022-04-21 HISTORY — DX: Iron deficiency anemia, unspecified: D50.9

## 2022-04-21 HISTORY — DX: Pneumonia, unspecified organism: J18.9

## 2022-04-21 NOTE — Pre-Procedure Instructions (Signed)
Call to Dr. Ines Bloomer office to inquire about the Eliquis-lovenox bridge. Was informed that they have contacted Dr. Raul Del and he will be ordering lovenox injections for the patient and the patient should be off Eliquis 5 days then bridge the entire time. They will call the patient. Message left for the patient to expect a call from Dr. Gust Brooms office regarding this.

## 2022-04-21 NOTE — Patient Instructions (Addendum)
Your procedure is scheduled on: Friday, June 2 Report to the Registration Desk on the 1st floor of the [facility] at the time you were given by the radiology department.   REMEMBER: Instructions that are not followed completely may result in serious medical risk, up to and including death; or upon the discretion of your surgeon and anesthesiologist your surgery may need to be rescheduled.  Do not eat food after midnight the night before surgery.  No gum chewing, lozengers or hard candies.  You may however, drink CLEAR liquids up to 2 hours before you are scheduled to arrive for your surgery. Do not drink anything within 2 hours of your scheduled arrival time.  Clear liquids include: - water  - apple juice without pulp - gatorade (not RED colors) - black coffee or tea (Do NOT add milk or creamers to the coffee or tea) Do NOT drink anything that is not on this list.  DO NOT TAKE ANY MEDICATIONS THE MORNING OF SURGERY   Follow recommendations from Cardiologist, Pulmonologist or PCP regarding stopping Eliquis (apixaban). Dr. Raul Del was contacted by Dr. Ines Bloomer office to set up Lovenox injections.  One week prior to surgery: starting May 26 Stop Anti-inflammatories (NSAIDS) such as Advil, Aleve, Ibuprofen, Motrin, Naproxen, Naprosyn and Aspirin based products such as Excedrin, Goodys Powder, BC Powder. Stop ANY OVER THE COUNTER supplements until after surgery. You may however, continue to take Tylenol if needed for pain up until the day of surgery.  No Alcohol for 24 hours before or after surgery.  No Smoking including e-cigarettes for 24 hours prior to surgery.  No chewable tobacco products for at least 6 hours prior to surgery.  No nicotine patches on the day of surgery.  Do not use any "recreational" drugs for at least a week prior to your surgery.  Please be advised that the combination of cocaine and anesthesia may have negative outcomes, up to and including death. If you test  positive for cocaine, your surgery will be cancelled.  On the morning of surgery brush your teeth with toothpaste and water, you may rinse your mouth with mouthwash if you wish. Do not swallow any toothpaste or mouthwash.  Use CHG Soap or wipes as directed on instruction sheet.  Do not wear jewelry, make-up, hairpins, clips or nail polish.  Do not wear lotions, powders, or perfumes.   Do not shave body from the neck down 48 hours prior to surgery just in case you cut yourself which could leave a site for infection.  Also, freshly shaved skin may become irritated if using the CHG soap.  Contact lenses, hearing aids and dentures may not be worn into surgery.  Do not bring valuables to the hospital. [facility] is not responsible for any missing/lost belongings or valuables.   Notify your doctor if there is any change in your medical condition (cold, fever, infection).  Wear comfortable clothing (specific to your surgery type) to the hospital.  After surgery, you can help prevent lung complications by doing breathing exercises.  Take deep breaths and cough every 1-2 hours. Your doctor may order a device called an Incentive Spirometer to help you take deep breaths.  If you are being discharged the day of surgery, you will not be allowed to drive home. You will need a responsible adult (18 years or older) to drive you home and stay with you that night.   If you are taking public transportation, you will need to have a responsible adult (18  years or older) with you. Please confirm with your physician that it is acceptable to use public transportation.   Please call the [place] Dept. at [phone] if you have any questions about these instructions.  Surgery Visitation Policy:  Patients undergoing a surgery or procedure may have two family members or support persons with them as long as the person is not COVID-19 positive or experiencing its symptoms.   Preparing for  Surgery with [place] (CHG) Soap    Before surgery, you can play an important role by reducing the number of germs on your skin.  CHG (Chlorhexidine gluconate) soap is an antiseptic cleanser which kills germs and bonds with the skin to continue killing germs even after washing.  Please do not use if you have an allergy to CHG or antibacterial soaps. If your skin becomes reddened/irritated stop using the CHG.  1. Shower the NIGHT BEFORE SURGERY and the MORNING OF SURGERY with CHG soap.  2. If you choose to wash your hair, wash your hair first as usual with your normal shampoo.  3. After shampooing, rinse your hair and body thoroughly to remove the shampoo.  4. Use CHG as you would any other liquid soap. You can apply CHG directly to the skin and wash gently with a scrungie or a clean washcloth.  5. Apply the CHG soap to your body only from the neck down. Do not use on open wounds or open sores. Avoid contact with your eyes, ears, mouth, and genitals (private parts). Wash face and genitals (private parts) with your normal soap.  6. Wash thoroughly, paying special attention to the area where your surgery will be performed.  7. Thoroughly rinse your body with warm water.  8. Do not shower/wash with your normal soap after using and rinsing off the CHG soap.  9. Pat yourself dry with a clean towel.  10. Wear clean pajamas to bed the night before surgery.  12. Place clean sheets on your bed the night of your first shower and do not sleep with pets.  13. Shower again with the CHG soap on the day of surgery prior to arriving at the hospital.  14. Do not apply any deodorants/lotions/powders.  15. Please wear clean clothes to the hospital.   Preparing the Skin Before Surgery     To help prevent the risk of infection at your surgical site, we are now providing you with rinse-free Sage 2% Chlorhexidine Gluconate (CHG) disposable wipes.  The night before surgery: Shower  or bathe with warm water. Do not apply perfume, lotions, powders. Wait one hour after shower. Skin should be dry and cool. Open Sage wipe package - 6 disposable cloths are inside. Wipe body using one cloth for the right arm, one cloth for the left arm, one cloth for the right leg, one cloth for the left leg, one cloth for the chest/abdomen area (do not use on breasts if breast feeding), and one cloth for the back. 5. Do not rinse, allow to dry. 6. Skin may fee "tacky" for several minutes. 7. Dress in clean clothes. 8. Place clean sheets on your bed and do not sleep with pets.  REPEAT ABOVE ON THE MORNING OF SURGERY PRIOR TO ARRIVING TO [facility].

## 2022-04-28 ENCOUNTER — Ambulatory Visit
Admission: RE | Admit: 2022-04-28 | Discharge: 2022-04-28 | Disposition: A | Discharge status: Home / Self Care | Payer: BC Managed Care – PPO | Source: Ambulatory Visit | Attending: Surgery | Admitting: Surgery

## 2022-04-28 ENCOUNTER — Ambulatory Visit: Admission: RE | Admit: 2022-04-28 | Payer: BC Managed Care – PPO | Source: Ambulatory Visit

## 2022-04-28 DIAGNOSIS — J449 Chronic obstructive pulmonary disease, unspecified: Secondary | ICD-10-CM | POA: Diagnosis not present

## 2022-04-28 DIAGNOSIS — F17210 Nicotine dependence, cigarettes, uncomplicated: Secondary | ICD-10-CM | POA: Insufficient documentation

## 2022-04-28 DIAGNOSIS — C50412 Malignant neoplasm of upper-outer quadrant of left female breast: Secondary | ICD-10-CM

## 2022-04-28 DIAGNOSIS — D0512 Intraductal carcinoma in situ of left breast: Secondary | ICD-10-CM | POA: Diagnosis not present

## 2022-04-28 DIAGNOSIS — Z170 Estrogen receptor positive status [ER+]: Secondary | ICD-10-CM | POA: Insufficient documentation

## 2022-04-29 ENCOUNTER — Ambulatory Visit
Admission: RE | Admit: 2022-04-29 | Discharge: 2022-04-29 | Disposition: A | Discharge status: Home / Self Care | Payer: BC Managed Care – PPO | Source: Ambulatory Visit | Attending: Surgery | Admitting: Surgery

## 2022-04-29 ENCOUNTER — Ambulatory Visit: Payer: BC Managed Care – PPO | Admitting: Urgent Care

## 2022-04-29 ENCOUNTER — Encounter: Payer: Self-pay | Admitting: Surgery

## 2022-04-29 ENCOUNTER — Other Ambulatory Visit: Payer: Self-pay

## 2022-04-29 ENCOUNTER — Encounter
Admission: RE | Disposition: A | Discharge status: Home / Self Care | Payer: Self-pay | Source: Home / Self Care | Attending: Surgery

## 2022-04-29 ENCOUNTER — Telehealth: Payer: Self-pay

## 2022-04-29 ENCOUNTER — Ambulatory Visit
Admission: RE | Admit: 2022-04-29 | Discharge: 2022-04-29 | Disposition: A | Discharge status: Home / Self Care | Payer: BC Managed Care – PPO | Attending: Surgery | Admitting: Surgery

## 2022-04-29 DIAGNOSIS — J449 Chronic obstructive pulmonary disease, unspecified: Secondary | ICD-10-CM | POA: Insufficient documentation

## 2022-04-29 DIAGNOSIS — Z01812 Encounter for preprocedural laboratory examination: Secondary | ICD-10-CM

## 2022-04-29 DIAGNOSIS — F17210 Nicotine dependence, cigarettes, uncomplicated: Secondary | ICD-10-CM | POA: Insufficient documentation

## 2022-04-29 DIAGNOSIS — C50412 Malignant neoplasm of upper-outer quadrant of left female breast: Secondary | ICD-10-CM

## 2022-04-29 DIAGNOSIS — D0512 Intraductal carcinoma in situ of left breast: Secondary | ICD-10-CM | POA: Insufficient documentation

## 2022-04-29 DIAGNOSIS — Z170 Estrogen receptor positive status [ER+]: Secondary | ICD-10-CM | POA: Insufficient documentation

## 2022-04-29 HISTORY — PX: PARTIAL MASTECTOMY WITH AXILLARY SENTINEL LYMPH NODE BIOPSY: SHX6004

## 2022-04-29 HISTORY — PX: BREAST LUMPECTOMY: SHX2

## 2022-04-29 LAB — POCT PREGNANCY, URINE: Preg Test, Ur: NEGATIVE

## 2022-04-29 SURGERY — PARTIAL MASTECTOMY WITH AXILLARY SENTINEL LYMPH NODE BIOPSY
Anesthesia: General | Site: Breast | Laterality: Left

## 2022-04-29 MED ORDER — ACETAMINOPHEN 10 MG/ML IV SOLN
INTRAVENOUS | Status: DC | PRN
  Administered 2022-04-29: 1000 mg via INTRAVENOUS

## 2022-04-29 MED ORDER — BUPIVACAINE-EPINEPHRINE (PF) 0.5% -1:200000 IJ SOLN
INTRAMUSCULAR | Status: AC
  Filled 2022-04-29: qty 30

## 2022-04-29 MED ORDER — SODIUM CHLORIDE FLUSH 0.9 % IV SOLN
INTRAVENOUS | Status: AC
  Filled 2022-04-29: qty 10

## 2022-04-29 MED ORDER — MIDAZOLAM HCL 2 MG/2ML IJ SOLN
INTRAMUSCULAR | Status: DC | PRN
  Administered 2022-04-29: 2 mg via INTRAVENOUS

## 2022-04-29 MED ORDER — STERILE WATER FOR IRRIGATION IR SOLN
Status: DC | PRN
  Administered 2022-04-29: 1000 mL

## 2022-04-29 MED ORDER — ONDANSETRON HCL 4 MG/2ML IJ SOLN
INTRAMUSCULAR | Status: AC
  Filled 2022-04-29: qty 2

## 2022-04-29 MED ORDER — ACETAMINOPHEN 10 MG/ML IV SOLN
INTRAVENOUS | Status: AC
  Filled 2022-04-29: qty 100

## 2022-04-29 MED ORDER — ONDANSETRON HCL 4 MG/2ML IJ SOLN
INTRAMUSCULAR | Status: DC | PRN
  Administered 2022-04-29: 4 mg via INTRAVENOUS

## 2022-04-29 MED ORDER — FENTANYL CITRATE (PF) 100 MCG/2ML IJ SOLN
25.0000 ug | INTRAMUSCULAR | Status: DC | PRN
  Administered 2022-04-29 (×3): 25 ug via INTRAVENOUS

## 2022-04-29 MED ORDER — TECHNETIUM TC 99M TILMANOCEPT KIT
0.9930 | PACK | Freq: Once | INTRAVENOUS | Status: AC | PRN
  Administered 2022-04-29: 0.993 via INTRADERMAL

## 2022-04-29 MED ORDER — ACETAMINOPHEN 325 MG PO TABS: 650.0000 mg | ORAL_TABLET | Freq: Three times a day (TID) | ORAL | 0 refills | Status: AC | PRN

## 2022-04-29 MED ORDER — FENTANYL CITRATE (PF) 250 MCG/5ML IJ SOLN
INTRAMUSCULAR | Status: AC
  Filled 2022-04-29: qty 5

## 2022-04-29 MED ORDER — FAMOTIDINE 20 MG PO TABS
20.0000 mg | ORAL_TABLET | Freq: Once | ORAL | Status: AC
  Administered 2022-04-29: 20 mg via ORAL

## 2022-04-29 MED ORDER — OXYCODONE-ACETAMINOPHEN 5-325 MG PO TABS: 1.0000 | ORAL_TABLET | Freq: Three times a day (TID) | ORAL | 0 refills | Status: DC | PRN

## 2022-04-29 MED ORDER — CEFAZOLIN SODIUM-DEXTROSE 2-4 GM/100ML-% IV SOLN
2.0000 g | INTRAVENOUS | Status: AC
  Administered 2022-04-29: 2 g via INTRAVENOUS
  Filled 2022-04-29: qty 100

## 2022-04-29 MED ORDER — LACTATED RINGERS IV SOLN: INTRAVENOUS | Status: DC

## 2022-04-29 MED ORDER — HYDROCODONE-ACETAMINOPHEN 5-325 MG PO TABS: 1.0000 | ORAL_TABLET | Freq: Four times a day (QID) | ORAL | 0 refills | Status: DC | PRN

## 2022-04-29 MED ORDER — LIDOCAINE HCL 1 % IJ SOLN
INTRAMUSCULAR | Status: DC | PRN
  Administered 2022-04-29: 20 mL via INTRAMUSCULAR

## 2022-04-29 MED ORDER — FENTANYL CITRATE (PF) 100 MCG/2ML IJ SOLN
INTRAMUSCULAR | Status: AC
  Filled 2022-04-29: qty 2

## 2022-04-29 MED ORDER — FENTANYL CITRATE (PF) 100 MCG/2ML IJ SOLN
INTRAMUSCULAR | Status: AC
  Administered 2022-04-29: 25 ug via INTRAVENOUS
  Filled 2022-04-29: qty 2

## 2022-04-29 MED ORDER — DEXAMETHASONE SODIUM PHOSPHATE 10 MG/ML IJ SOLN
INTRAMUSCULAR | Status: DC | PRN
  Administered 2022-04-29: 10 mg via INTRAVENOUS

## 2022-04-29 MED ORDER — FAMOTIDINE 20 MG PO TABS
ORAL_TABLET | ORAL | Status: AC
  Filled 2022-04-29: qty 1

## 2022-04-29 MED ORDER — DEXAMETHASONE SODIUM PHOSPHATE 10 MG/ML IJ SOLN
INTRAMUSCULAR | Status: AC
  Filled 2022-04-29: qty 1

## 2022-04-29 MED ORDER — METHYLENE BLUE 1 % INJ SOLN
INTRAVENOUS | Status: AC
  Filled 2022-04-29: qty 10

## 2022-04-29 MED ORDER — LIDOCAINE HCL (CARDIAC) PF 100 MG/5ML IV SOSY
PREFILLED_SYRINGE | INTRAVENOUS | Status: DC | PRN
  Administered 2022-04-29: 100 mg via INTRAVENOUS

## 2022-04-29 MED ORDER — CHLORHEXIDINE GLUCONATE 0.12 % MT SOLN
OROMUCOSAL | Status: AC
  Administered 2022-04-29: 15 mL
  Filled 2022-04-29: qty 15

## 2022-04-29 MED ORDER — MIDAZOLAM HCL 2 MG/2ML IJ SOLN
INTRAMUSCULAR | Status: AC
  Filled 2022-04-29: qty 2

## 2022-04-29 MED ORDER — PROPOFOL 10 MG/ML IV BOLUS
INTRAVENOUS | Status: AC
  Filled 2022-04-29: qty 20

## 2022-04-29 MED ORDER — ORAL CARE MOUTH RINSE: 15.0000 mL | Freq: Once | OROMUCOSAL | Status: DC

## 2022-04-29 MED ORDER — CHLORHEXIDINE GLUCONATE CLOTH 2 % EX PADS: 6.0000 | MEDICATED_PAD | Freq: Once | CUTANEOUS | Status: DC

## 2022-04-29 MED ORDER — DOCUSATE SODIUM 100 MG PO CAPS: 100.0000 mg | ORAL_CAPSULE | Freq: Two times a day (BID) | ORAL | 0 refills | Status: AC | PRN

## 2022-04-29 MED ORDER — LIDOCAINE HCL (PF) 1 % IJ SOLN
INTRAMUSCULAR | Status: AC
  Filled 2022-04-29: qty 30

## 2022-04-29 MED ORDER — CHLORHEXIDINE GLUCONATE 0.12 % MT SOLN: 15.0000 mL | Freq: Once | OROMUCOSAL | Status: DC

## 2022-04-29 MED ORDER — FENTANYL CITRATE (PF) 100 MCG/2ML IJ SOLN
INTRAMUSCULAR | Status: DC | PRN
Start: 2022-04-29 — End: 2022-04-29
  Administered 2022-04-29: 50 ug via INTRAVENOUS
  Administered 2022-04-29 (×2): 25 ug via INTRAVENOUS
  Administered 2022-04-29: 50 ug via INTRAVENOUS

## 2022-04-29 MED ORDER — ONDANSETRON HCL 4 MG/2ML IJ SOLN: 4.0000 mg | Freq: Once | INTRAMUSCULAR | Status: DC | PRN

## 2022-04-29 MED ORDER — LIDOCAINE HCL (PF) 2 % IJ SOLN
INTRAMUSCULAR | Status: AC
  Filled 2022-04-29: qty 5

## 2022-04-29 MED ORDER — PROPOFOL 10 MG/ML IV BOLUS
INTRAVENOUS | Status: DC | PRN
  Administered 2022-04-29: 150 mg via INTRAVENOUS

## 2022-04-29 SURGICAL SUPPLY — 51 items
APPLIER CLIP 11 MED OPEN (CLIP)
BLADE SURG 15 STRL LF DISP TIS (BLADE) ×1 IMPLANT
BLADE SURG 15 STRL SS (BLADE) ×1
CHLORAPREP W/TINT 26 (MISCELLANEOUS) ×2 IMPLANT
CLIP APPLIE 11 MED OPEN (CLIP) IMPLANT
CNTNR SPEC 2.5X3XGRAD LEK (MISCELLANEOUS)
CONT SPEC 4OZ STER OR WHT (MISCELLANEOUS)
CONTAINER SPEC 2.5X3XGRAD LEK (MISCELLANEOUS) IMPLANT
DERMABOND ADVANCED (GAUZE/BANDAGES/DRESSINGS) ×1
DERMABOND ADVANCED .7 DNX12 (GAUZE/BANDAGES/DRESSINGS) ×1 IMPLANT
DEVICE DSSCT PLSMBLD 3.0S LGHT (MISCELLANEOUS) ×1 IMPLANT
DEVICE DUBIN SPECIMEN MAMMOGRA (MISCELLANEOUS) ×2 IMPLANT
DRAPE LAPAROTOMY 77X122 PED (DRAPES) ×2 IMPLANT
ELECT CAUTERY BLADE TIP 2.5 (TIP) ×2
ELECT REM PT RETURN 9FT ADLT (ELECTROSURGICAL) ×2
ELECTRODE CAUTERY BLDE TIP 2.5 (TIP) ×1 IMPLANT
ELECTRODE REM PT RTRN 9FT ADLT (ELECTROSURGICAL) ×1 IMPLANT
GAUZE 4X4 16PLY ~~LOC~~+RFID DBL (SPONGE) ×2 IMPLANT
GLOVE BIOGEL PI IND STRL 7.0 (GLOVE) ×1 IMPLANT
GLOVE BIOGEL PI INDICATOR 7.0 (GLOVE) ×1
GLOVE SURG SYN 6.5 ES PF (GLOVE) ×4 IMPLANT
GLOVE SURG SYN 6.5 PF PI (GLOVE) ×2 IMPLANT
GOWN STRL REUS W/ TWL LRG LVL3 (GOWN DISPOSABLE) ×3 IMPLANT
GOWN STRL REUS W/TWL LRG LVL3 (GOWN DISPOSABLE) ×3
HANDLE YANKAUER SUCT BULB TIP (MISCELLANEOUS) ×1 IMPLANT
KIT MARKER MARGIN INK (KITS) ×2 IMPLANT
KIT TURNOVER KIT A (KITS) ×2 IMPLANT
LABEL OR SOLS (LABEL) ×2 IMPLANT
LIGHT WAVEGUIDE WIDE FLAT (MISCELLANEOUS) IMPLANT
LOCALIZER INSTRUMENT COVER KIT ×1 IMPLANT
LOCALIZER SURIGAL PROBE ×1 IMPLANT
MANIFOLD NEPTUNE II (INSTRUMENTS) ×2 IMPLANT
MARKER MARGIN CORRECT CLIP (MARKER) ×2 IMPLANT
NEEDLE HYPO 22GX1.5 SAFETY (NEEDLE) ×4 IMPLANT
PACK BASIN MINOR ARMC (MISCELLANEOUS) ×2 IMPLANT
PLASMABLADE 3.0S W/LIGHT (MISCELLANEOUS) ×2
SET LOCALIZER 20 PROBE US (MISCELLANEOUS) ×2 IMPLANT
SUT MNCRL 4-0 (SUTURE) ×2
SUT MNCRL 4-0 27XMFL (SUTURE) ×2
SUT SILK 3 0 (SUTURE) ×1
SUT SILK 3 0 12 30 (SUTURE) IMPLANT
SUT SILK 3-0 18XBRD TIE 12 (SUTURE) IMPLANT
SUT VIC AB 3-0 SH 27 (SUTURE) ×2
SUT VIC AB 3-0 SH 27X BRD (SUTURE) ×2 IMPLANT
SUTURE MNCRL 4-0 27XMF (SUTURE) ×2 IMPLANT
SYR 20ML LL LF (SYRINGE) ×2 IMPLANT
SYR BULB IRRIG 60ML STRL (SYRINGE) ×2 IMPLANT
TRAP NEPTUNE SPECIMEN COLLECT (MISCELLANEOUS) ×2 IMPLANT
TUBING CONNECTING 10 (TUBING) ×3 IMPLANT
WATER STERILE IRR 1000ML POUR (IV SOLUTION) ×2 IMPLANT
WATER STERILE IRR 500ML POUR (IV SOLUTION) ×2 IMPLANT

## 2022-04-29 NOTE — OR Nursing (Signed)
Maria Young from [facility] in for SN injection [phone]

## 2022-04-29 NOTE — Discharge Instructions (Addendum)
Removal, Care After This sheet gives you information about how to care for yourself after your procedure. Your health care provider may also give you more specific instructions. If you have problems or questions, contact your health care provider. What can I expect after the procedure? After the procedure, it is common to have: Soreness. Bruising. Itching. Follow these instructions at home: site care Follow instructions from your health care provider about how to take care of your site. Make sure you: Wash your hands with soap and water before and after you change your bandage (dressing). If soap and water are not available, use hand sanitizer. Leave stitches (sutures), skin glue, or adhesive strips in place. These skin closures may need to stay in place for 2 weeks or longer. If adhesive strip edges start to loosen and curl up, you may trim the loose edges. Do not remove adhesive strips completely unless your health care provider tells you to do that. If the area bleeds or bruises, apply gentle pressure for 10 minutes. OK TO SHOWER IN 24HRS  Check your site every day for signs of infection. Check for: Redness, swelling, or pain. Fluid or blood. Warmth. Pus or a bad smell.  General instructions Rest and then return to your normal activities as told by your health care provider.  tylenol and advil as needed for discomfort.  Please alternate between the two every four hours as needed for pain.    Use narcotics, if prescribed, only when tylenol and motrin is not enough to control pain.  325-631m every 8hrs to max of 30039m24hrs (including the 32559mn every norco dose) for the tylenol.    Advil up to 800m13mr dose every 8hrs as needed for pain.   Keep all follow-up visits as told by your health care provider. This is important. Contact a health care provider if: You have redness, swelling, or pain around your site. You have fluid or blood coming from your site. Your site feels warm to  the touch. You have pus or a bad smell coming from your site. You have a fever. Your sutures, skin glue, or adhesive strips loosen or come off sooner than expected. Get help right away if: You have bleeding that does not stop with pressure or a dressing. Summary After the procedure, it is common to have some soreness, bruising, and itching at the site. Follow instructions from your health care provider about how to take care of your site. Check your site every day for signs of infection. Contact a health care provider if you have redness, swelling, or pain around your site, or your site feels warm to the touch. Keep all follow-up visits as told by your health care provider. This is important. This information is not intended to replace advice given to you by your health care provider. Make sure you discuss any questions you have with your health care provider. Document Released: 12/11/2015 Document Revised: 05/14/2018 Document Reviewed: 05/14/2018 Elsevier Interactive Patient Education  2019 [employer] [place] in 2 days and lovenox tomorrow  AMBULATORY SURGERY  DISCHARGE INSTRUCTIONS   The drugs that you were given will stay in your system until tomorrow so for the next 24 hours you should not:  Drive an automobile Make any legal decisions Drink any alcoholic beverage   You may resume regular meals tomorrow.  Today it is better to start with liquids and gradually work up to solid foods.  You may eat anything you prefer, but it is better  to start with liquids, then soup and crackers, and gradually work up to solid foods.   Please notify your doctor immediately if you have any unusual bleeding, trouble breathing, redness and pain at the surgery site, drainage, fever, or pain not relieved by medication.    Additional Instructions:        Please contact your physician with any problems or Same Day Surgery at [phone], Monday through Friday 6 am to 4 pm,  or [place] at [facility] number at [phone].

## 2022-04-29 NOTE — Transfer of Care (Signed)
Immediate Anesthesia Transfer of Care Note  Patient: Maria Young  Procedure(s) Performed: PARTIAL MASTECTOMY WITH AXILLARY SENTINEL LYMPH NODE BIOPSY (Left: Breast)  Patient Location: PACU  Anesthesia Type:General  Level of Consciousness: awake, alert  and oriented  Airway & Oxygen Therapy: Patient Spontanous Breathing  Post-op Assessment: Report given to RN and Post -op Vital signs reviewed and stable  Post vital signs: Reviewed and stable  Last Vitals:  Vitals Value Taken Time  BP 145/70 04/29/22 1145  Temp 36.1 C 04/29/22 1144  Pulse 64 04/29/22 1145  Resp 15 04/29/22 1145  SpO2 100 % 04/29/22 1145  Vitals shown include unvalidated device data.  Last Pain:  Vitals:   04/29/22 1144  TempSrc:   PainSc: 0-No pain         Complications: No notable events documented.

## 2022-04-29 NOTE — Op Note (Signed)
Preoperative diagnosis:  left breast carcinoma.  Postoperative diagnosis: same.   Procedure: RF tag-localized left breast partial mastectomy.                      left Axillary Sentinel Lymph node biopsy  Anesthesia: GETA  Surgeon: Dr. Benjamine Sprague  Wound Classification: Clean  Indications: Patient is a 46 y.o. female with a nonpalpable left breast mass noted on mammography with core biopsy demonstrating carcinoma,  requires RF localizer placement, partial mastectomy for treatment with sentinel lymph node biopsy.   Specimen: left Breast mass, Sentinel Lymph nodes x 3  Complications: thermal injury to skin   Estimated Blood Loss: 83m  Findings: 1. Specimen mammography shows marker and RF localizer on specimen 2. Pathology call refers gross examination of margins was normal 3. No other palpable mass or lymph node identified.   Operation performed with curative intent:Yes  Tracer(s) used to identify sentinel nodes in the upfront surgery (non-neoadjuvant) setting (select all that apply):Radioactive Tracer  Tracer(s) used to identify sentinel nodes in the neoadjuvant setting (select all that apply):N/A  All nodes (colored or non-colored) present at the end of a dye-filled lymphatic channel were removed:N/A  All significantly radioactive nodes were removed:Yes  All palpable suspicious nodes were removed:N/A  Biopsy-proven positive nodes marked with clips prior to chemotherapy were identified and removed:N/A    Description of procedure: RF localization was performed by radiology prior to procedure. In the nuclear medicine suite, the subareolar region was injected with Tc-99 sulfur colloid the morning of procedure. Localization studies were reviewed. The patient was taken to the operating room and placed supine on the operating table, and after general anesthesia the left breast and axilla were prepped and draped in the usual sterile fashion. A time-out was completed verifying  correct patient, procedure, site, positioning, and implant(s) and/or special equipment prior to beginning this procedure.  By identifying the RF localizer, the probable trajectory and location of the mass was visualized. A skin incision was planned in such a way as to minimize the amount of dissection to reach the mass.  The skin incision was made after infusion of local. Flaps were raised and  Sharp and blunt dissection was then taken down to the mass, taking care to include the entire RF localizer and a margin of grossly normal tissue.  During dissection, full thickness dermal injury occurred.  This was closed with interrupted 4-0 vicryl. The specimen was removed. The specimen was oriented with paint and sent to radiology with the localization studies. Confirmation was received that the entire target lesion had been resected. A hand-held gamma probe was used to identify the location of the hottest spot in the axilla. An incision was made around the caudal axillary hairline. Sharp and blunt Dissection was carried down to subdermal facias. The probe was placed within wound and again, the point of maximal count was found. Dissection continue until nodule was identified. The probe was placed in contact with the node and 5000 counts were recorded. The node was excised in its entirety. Ex vivo, the node measured 5000 counts when placed on the probe. The bed of the node measured 300 counts. Two additional hot spot was detected and the node was excised in similar fashion. No additional hot spots were identified. No clinically abnormal nodes were palpated. Both wounds irrigated, hemostasis was achieved and the wound closed in layers with  interrupted sutures of 3-0 Vicryl in deep dermal layer and a running subcuticular suture of Monocryl  4-0, then dressed with dermabond. The patient tolerated the procedure well and was taken to the postanesthesia care unit in stable condition. Sponge and instrument count correct at  end of procedure.

## 2022-04-29 NOTE — Anesthesia Preprocedure Evaluation (Signed)
Anesthesia Evaluation  Patient identified by MRN, date of birth, ID band Patient awake    Reviewed: Allergy & Precautions, NPO status , Patient's Chart, lab work & pertinent test results  Airway Mallampati: III  TM Distance: >3 FB Neck ROM: Full    Dental  (+) Caps, Dental Advisory Given,    Pulmonary neg pulmonary ROS, COPD, Current Smoker,    Pulmonary exam normal breath sounds clear to auscultation       Cardiovascular Exercise Tolerance: Good negative cardio ROS Normal cardiovascular exam Rhythm:Regular Rate:Normal     Neuro/Psych negative neurological ROS  negative psych ROS   GI/Hepatic negative GI ROS, Neg liver ROS,   Endo/Other  negative endocrine ROS  Renal/GU negative Renal ROS     Musculoskeletal   Abdominal Normal abdominal exam  (+)   Peds negative pediatric ROS (+)  Hematology negative hematology ROS (+) Blood dyscrasia, anemia ,   Anesthesia Other Findings Past Medical History: 04/13/2022: Breast cancer ([place])     Comment:  left (estrogen receptor positive) 01/26/2021: DVT (deep venous thrombosis) ([facility])     Comment:  left leg No date: Heart murmur No date: Iron deficiency anemia 06/11/2020: PE (pulmonary thromboembolism) ([facility]) No date: Pneumonia  Past Surgical History: 2005: ANKLE FRACTURE SURGERY; Right     Comment:  internal fixation 04/13/2022: BREAST BIOPSY; Left     Comment:  [place] Core bx coil clip-path pending 10/30/2019: ORIF WRIST FRACTURE; Left     Comment:  Procedure: OPEN REDUCTION INTERNAL FIXATION (ORIF) WRIST              FRACTURE with percutaneous screw;  Surgeon: Corky Mull, MD;  Location: [facility] ORS;  Service: Orthopedics;                Laterality: Left;  BMI    Body Mass Index: 25.68 kg/m      Reproductive/Obstetrics negative OB ROS                             Anesthesia Physical Anesthesia Plan  ASA: 2  Anesthesia Plan:  General   Post-op Pain Management:    Induction: Intravenous  PONV Risk Score and Plan: 1 and Ondansetron and Dexamethasone  Airway Management Planned: LMA  Additional Equipment:   Intra-op Plan:   Post-operative Plan: Extubation in OR  Informed Consent: I have reviewed the patients History and Physical, chart, labs and discussed the procedure including the risks, benefits and alternatives for the proposed anesthesia with the patient or authorized representative who has indicated his/her understanding and acceptance.     Dental Advisory Given  Plan Discussed with: CRNA and Surgeon  Anesthesia Plan Comments:         Anesthesia Quick Evaluation

## 2022-04-29 NOTE — Interval H&P Note (Signed)
No change. OK to proceed.

## 2022-04-29 NOTE — Anesthesia Postprocedure Evaluation (Signed)
Anesthesia Post Note  Patient: Maria Young  Procedure(s) Performed: PARTIAL MASTECTOMY WITH AXILLARY SENTINEL LYMPH NODE BIOPSY (Left: Breast)  Patient location during evaluation: PACU Anesthesia Type: General Level of consciousness: awake and awake and alert Pain management: satisfactory to patient Vital Signs Assessment: post-procedure vital signs reviewed and stable Respiratory status: spontaneous breathing and nonlabored ventilation Cardiovascular status: stable Anesthetic complications: no   No notable events documented.   Last Vitals:  Vitals:   04/29/22 1300 04/29/22 1312  BP: (!) 141/83 (!) 143/84  Pulse: (!) 55 (!) 59  Resp: 15 16  Temp: 37.2 C 37.2 C  SpO2: 97% 100%    Last Pain:  Vitals:   04/29/22 1312  TempSrc: Temporal  PainSc: 3                  VAN STAVEREN,Briannon Boggio

## 2022-04-29 NOTE — Anesthesia Procedure Notes (Signed)
Procedure Name: LMA Insertion Date/Time: 04/29/2022 10:06 AM Performed by: Carmelina Paddock, RN Pre-anesthesia Checklist: Patient identified, Patient being monitored, Timeout performed, Emergency Drugs available and Suction available Patient Re-evaluated:Patient Re-evaluated prior to induction Oxygen Delivery Method: Circle system utilized Preoxygenation: Pre-oxygenation with 100% oxygen Induction Type: IV induction Ventilation: Mask ventilation without difficulty LMA: LMA inserted LMA Size: 3.5 Tube type: Oral Number of attempts: 1 Placement Confirmation: positive ETCO2 and breath sounds checked- equal and bilateral Tube secured with: Tape Dental Injury: Teeth and Oropharynx as per pre-operative assessment

## 2022-04-29 NOTE — Progress Notes (Signed)
Per Dr. Lysle Pearl patient can restart lovenox tomorrow and eliquis in 2 days

## 2022-04-30 ENCOUNTER — Encounter: Payer: Self-pay | Admitting: Surgery

## 2022-05-02 ENCOUNTER — Encounter: Payer: Self-pay | Admitting: *Deleted

## 2022-05-02 NOTE — Progress Notes (Signed)
Called patient to check in on her recovery from surgery and see if she had any needs.   She is doing well, just some soreness.  She is anxious to get pathology results back, told her it usually takes about a week.  She knows to call if she needs anything.

## 2022-05-03 ENCOUNTER — Other Ambulatory Visit: Payer: Self-pay | Admitting: Pathology

## 2022-05-03 LAB — SURGICAL PATHOLOGY

## 2022-05-04 ENCOUNTER — Telehealth: Payer: Self-pay

## 2022-05-04 NOTE — Telephone Encounter (Signed)
Oncotype order placed on path report from 04/29/22. Order number- [id number]

## 2022-05-10 ENCOUNTER — Telehealth: Payer: Self-pay | Admitting: *Deleted

## 2022-05-10 NOTE — Progress Notes (Signed)
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  INTERVAL HISTORY: Patient returns to clinic today for further evaluation, discussion of her final pathology results, and additional treatment planning.  She tolerated her lumpectomy well without significant side effects.  She currently feels well and is asymptomatic.  She has no neurologic complaints.  She denies any recent fevers or illnesses.  She has a good appetite and denies weight loss.  She has no chest pain, shortness of breath, cough, or hemoptysis.  She denies any nausea, vomiting, constipation, or diarrhea.  She has no urinary complaints.  Patient offers no specific complaints today.  REVIEW OF SYSTEMS:   Review of Systems  Constitutional: Negative.  Negative for fever, malaise/fatigue and weight loss.  Respiratory: Negative.  Negative for cough, hemoptysis and shortness of breath.   Cardiovascular: Negative.  Negative for chest pain and leg swelling.  Gastrointestinal: Negative.  Negative for abdominal pain.  Genitourinary: Negative.  Negative for dysuria.  Musculoskeletal: Negative.  Negative for back pain.  Skin: Negative.  Negative for rash.  Neurological: Negative.  Negative for dizziness, focal weakness, weakness and headaches.  Psychiatric/Behavioral: Negative.  The patient is not nervous/anxious.     As per HPI. Otherwise, a complete review of systems is negative.  PAST MEDICAL HISTORY: Past Medical History:  Diagnosis Date   Breast cancer ([place]) 04/13/2022   left (estrogen receptor positive)   DVT (deep venous thrombosis) ([place]) 01/26/2021   left leg   Heart  murmur    Iron deficiency anemia    PE (pulmonary thromboembolism) ([place]) 06/11/2020   Pneumonia     PAST SURGICAL HISTORY: Past Surgical History:  Procedure Laterality Date   ANKLE FRACTURE SURGERY Right 2005   internal fixation   BREAST BIOPSY Left 04/13/2022   [place] Core bx coil clip-path pending   ORIF WRIST FRACTURE Left 10/30/2019   Procedure: OPEN REDUCTION INTERNAL FIXATION (ORIF) WRIST FRACTURE with percutaneous screw;  Surgeon: Corky Mull, MD;  Location: [facility] ORS;  Service: Orthopedics;  Laterality: Left;   PARTIAL MASTECTOMY WITH AXILLARY SENTINEL LYMPH NODE BIOPSY Left 04/29/2022   Procedure: PARTIAL MASTECTOMY WITH AXILLARY SENTINEL LYMPH NODE BIOPSY;  Surgeon: Benjamine Sprague, DO;  Location: [facility] ORS;  Service: General;  Laterality: Left;    FAMILY HISTORY: Family History  Problem Relation Age of Onset   Diabetes Father    Heart disease Father    Hyperlipidemia Brother    Prostate cancer Paternal Grandfather        d. 82s   Breast cancer Other    Breast cancer Cousin        dx 58s   Breast cancer Maternal Great-grandmother     ADVANCED DIRECTIVES (Y/N):  N  HEALTH MAINTENANCE: Social History   Tobacco Use   Smoking status: Every Day    Packs/day: 0.50    Types: Cigarettes   Smokeless tobacco: Never  Vaping Use   Vaping Use: Never used  Substance Use Topics   Alcohol use: Yes    Alcohol/week: 14.0 standard drinks of alcohol    Types: 14 Glasses of wine per week   Drug use: Not Currently    Types: Marijuana  Colonoscopy:  PAP:  Bone density:  Lipid panel:  Allergies  Allergen Reactions   Azithromycin Hives and Itching    Current Outpatient Medications  Medication Sig Dispense Refill   acetaminophen (TYLENOL) 325 MG tablet Take 2 tablets (650 mg total) by mouth every 8 (eight) hours as needed for mild pain. 40 tablet 0   apixaban (ELIQUIS) 2.5 MG TABS tablet Take 2.5 mg by mouth 2 (two) times daily.     HYDROcodone-acetaminophen (NORCO) 5-325  MG tablet Take 1 tablet by mouth every 6 (six) hours as needed for up to 6 doses for moderate pain. (Patient not taking: Reported on 05/12/2022) 6 tablet 0   oxyCODONE-acetaminophen (PERCOCET) 5-325 MG tablet Take 1 tablet by mouth every 8 (eight) hours as needed for up to 6 doses for severe pain. (Patient not taking: Reported on 05/12/2022) 6 tablet 0   No current facility-administered medications for this visit.    OBJECTIVE: Vitals:   05/12/22 1058  BP: (!) 125/8  Pulse: (!) 50  Resp: 16  Temp: (!) 96.2 F (35.7 C)  SpO2: 100%     Body mass index is 25.04 kg/m.    ECOG FS:0 - Asymptomatic  General: Well-developed, well-nourished, no acute distress. Eyes: Pink conjunctiva, anicteric sclera. HEENT: Normocephalic, moist mucous membranes. Lungs: No audible wheezing or coughing. Heart: Regular rate and rhythm. Abdomen: Soft, nontender, no obvious distention. Musculoskeletal: No edema, cyanosis, or clubbing. Neuro: Alert, answering all questions appropriately. Cranial nerves grossly intact. Skin: No rashes or petechiae noted. Psych: Normal affect.  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 EXAM: SPECIMEN RADIOGRAPH OF THE LEFT BREAST COMPARISON:  Previous exams. FINDINGS: Status post excision of the LEFT breast. The RF ID tag and COIL shaped clip are present within the specimen. COIL clip is at I4. IMPRESSION: Specimen  radiograph of the LEFT breast. Electronically Signed   By: Valentino Saxon M.D.   On: 04/29/2022 10:57  NM Sentinel Node Inj-No Rpt (Breast)  Result Date: 04/29/2022 Sulfur Colloid was injected by the Nuclear Medicine Technologist for sentinel lymph node localization.   MM DIAG BREAST TOMO UNI LEFT  Result Date: 04/28/2022 CLINICAL DATA:  Postprocedure mammogram. EXAM: DIAGNOSTIC LEFT MAMMOGRAM POST ULTRASOUND-GUIDED RADIOFREQUENCY TAG PLACEMENT COMPARISON:  Previous exam(s). FINDINGS: 3D Mammographic images were obtained following ultrasound-guided radiofrequency tag placement. These demonstrate the RF tag immediately adjacent to the coil shaped biopsy clip at the site of biopsy-proven invasive mammary carcinoma in the slightly lower outer left breast at posterior depth. IMPRESSION: Appropriate location of the radiofrequency tag in the left breast. Final Assessment: Post Procedure Mammograms for Seed Placement Electronically Signed   By: Ileana Roup M.D.   On: 04/28/2022 17:13  [place] LT RADIO FREQUENCY TAG LOC US GUIDE  Result Date: 04/28/2022 CLINICAL DATA:  Invasive mammary carcinoma with mucinous fusion in the  left breast (coil clip) EXAM: MAMMOGRAPHIC GUIDED RADIOFREQUENCY DEVICE LOCALIZATION OF THE LEFT BREAST COMPARISON:  Previous exam. FINDINGS: Patient presents for radiofrequency device localization prior to lumpectomy. I met with the patient and we discussed the procedure of radiofrequency device localization including benefits and alternatives. We discussed the high likelihood of a successful procedure. We discussed the risks of the procedure including infection, bleeding, tissue injury and further surgery. Informed, written consent was given. The usual time-out protocol was performed immediately prior to the procedure. Using mammographic guidance, sterile technique, 1% lidocaine as local anesthesia, a radiofrequency tag was used to localize a 0.7 cm biopsy proven invasive mammary carcinoma in  the left breast using a lateral approach. Of note, the finding was initially described at the left breast 2 o'clock position but at the time of the procedure is noted more inferiorly at the 4 o'clock position. The biopsy clip is clearly seen within the mass, confirming the site of biopsy-proven malignancy. The follow-up mammogram images confirm that the RF device is in the expected location and are marked for Dr. Lysle Pearl. The patient tolerated the procedure well and was released from the [facility]. IMPRESSION: Radiofrequency device localization of the LEFT breast. No apparent complications. Electronically Signed   By: Ileana Roup M.D.   On: 04/28/2022 17:07  [place] LT BREAST BX W LOC DEV 1ST LESION IMG BX SPEC US GUIDE  Addendum Date: 04/19/2022   ADDENDUM REPORT: 04/19/2022 14:39 ADDENDUM: Pathology revealed GRADE 1 INVASIVE MAMMARY CARCINOMA, WITH MUCINOUS FEATURES of the LEFT breast, 2 o'clock (coil clip). This was found to be concordant by Dr. Dorise Bullion. Pathology results were discussed with the patient by telephone. The patient reported doing well after the biopsy with tenderness at the site. Post biopsy instructions and care were reviewed and questions were answered. The patient was encouraged to call [facility] for any additional concerns. A surgical referral will be arranged by Casper Harrison RN Oncology Navigator of [place]. Pathology results reported by Stacie Acres RN on 04/14/2022. Electronically Signed   By: Dorise Bullion III M.D.   On: 04/19/2022 14:39   Result Date: 04/19/2022 CLINICAL DATA:  Biopsy of a 2 o'clock left breast mass EXAM: ULTRASOUND GUIDED LEFT BREAST CORE NEEDLE BIOPSY COMPARISON:  None Available. PROCEDURE: I met with the patient and we discussed the procedure of ultrasound-guided biopsy, including benefits and alternatives. We discussed the high likelihood of a successful procedure. We  discussed the risks of the procedure, including infection, bleeding, tissue injury, clip migration, and inadequate sampling. Informed written consent was given. The usual time-out protocol was performed immediately prior to the procedure. Lesion quadrant: 2 o'clock left breast mass Using sterile technique and 1% Lidocaine as local anesthetic, under direct ultrasound visualization, a 12 gauge spring-loaded device was used to perform biopsy of a 2 o'clock left breast mass using a lateral approach. At the conclusion of the procedure a tissue marker clip was deployed into the biopsy cavity. Follow up 2 view mammogram was performed and dictated separately. IMPRESSION: Ultrasound guided biopsy of a 2 o'clock left breast mass. No apparent complications. Electronically Signed: By: Dorise Bullion III M.D. On: 04/13/2022 09:44   MM CLIP PLACEMENT LEFT  Result Date: 04/13/2022 CLINICAL DATA:  Evaluate biopsy marker EXAM: 3D DIAGNOSTIC LEFT MAMMOGRAM POST ULTRASOUND BIOPSY COMPARISON:  Previous exam(s). FINDINGS: 3D Mammographic images were obtained following ultrasound guided biopsy of a left breast mass. The biopsy marking clip  is in expected position at the site of biopsy. IMPRESSION: Appropriate positioning of the coil shaped biopsy marking clip at the site of biopsy in the biopsied left breast mass. Final Assessment: Post Procedure Mammograms for Marker Placement Electronically Signed   By: Dorise Bullion III M.D.   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  There was not enough tissue to send for [facility], therefore this order has been canceled.  Given the small size of her malignancy, and the fact that  it is a grade 1, no chemotherapy is necessary.  Patient has consultation with radiation oncology later this morning to discuss adjuvant XRT.  Given the ER/PR status of her malignancy and that the patient is premenopausal, she will benefit from tamoxifen for 5 years.  Return to clinic during the last week of her XRT for further evaluation and initiation of treatment.   Iron deficiency anemia: Patient states this is longstanding secondary to heavy menses.  She cannot tolerate oral iron supplementation.  We will further evaluate once treatment for breast cancer is complete. 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  Her first blood clot was noted on CT of the chest on June 11, 2020 which she states coincided with COVID-vaccine.  Patient was off treatment for several weeks and by report had a DVT on January 26, 2021. Unclear what work-up has been done up to this point.  Patient will likely require full work-up in the future. Genetics: Given patient's age, she was also given a genetics referral.  I spent a total of 30 minutes reviewing chart data, face-to-face evaluation with the patient, counseling and coordination of care as detailed above.    Patient expressed understanding and was in agreement with this plan. She also understands that She can call clinic at any time with any questions, concerns, or complaints.    Cancer Staging  Carcinoma of upper-outer quadrant of left breast in female, estrogen receptor positive ([place]) Staging form: Breast, AJCC 8th Edition - Clinical stage from 04/17/2022: Stage IA (cT1b, cN0, cM0, G1, ER+, PR+, HER2-) - Signed by Lloyd Huger, MD on 04/17/2022 Stage prefix: Initial diagnosis Histologic grading system: 3 grade system  Lloyd Huger, MD   05/13/2022 9:23 AM

## 2022-05-10 NOTE — Telephone Encounter (Signed)
Call from [facility] stating that there is a problem with the specimen they received in that it does not have the invasive tumor in it and when they called the lab they were only told that that was the specimen the pathologist chose. The original specimen had no tumor in itThey have sent the specimen back to [place] and are asking what went want them to do an dif there is another specimen that we want tested. Please return the call to let them know what to do. [phone] ext [phone]

## 2022-05-12 ENCOUNTER — Encounter: Payer: Self-pay | Admitting: Oncology

## 2022-05-12 ENCOUNTER — Inpatient Hospital Stay: Payer: BC Managed Care – PPO

## 2022-05-12 ENCOUNTER — Ambulatory Visit
Admission: RE | Admit: 2022-05-12 | Discharge: 2022-05-12 | Disposition: A | Discharge status: Home / Self Care | Payer: BC Managed Care – PPO | Source: Ambulatory Visit | Attending: Radiation Oncology | Admitting: Radiation Oncology

## 2022-05-12 ENCOUNTER — Inpatient Hospital Stay (HOSPITAL_BASED_OUTPATIENT_CLINIC_OR_DEPARTMENT_OTHER): Payer: BC Managed Care – PPO | Admitting: Licensed Clinical Social Worker

## 2022-05-12 ENCOUNTER — Inpatient Hospital Stay: Payer: BC Managed Care – PPO | Attending: Oncology | Admitting: Oncology

## 2022-05-12 ENCOUNTER — Encounter: Payer: Self-pay | Admitting: Licensed Clinical Social Worker

## 2022-05-12 VITALS — BP 125/8 | HR 50 | Temp 96.2°F | Resp 16 | Ht 74.0 in | Wt 195.0 lb

## 2022-05-12 DIAGNOSIS — Z803 Family history of malignant neoplasm of breast: Secondary | ICD-10-CM

## 2022-05-12 DIAGNOSIS — D500 Iron deficiency anemia secondary to blood loss (chronic): Secondary | ICD-10-CM | POA: Diagnosis not present

## 2022-05-12 DIAGNOSIS — C50412 Malignant neoplasm of upper-outer quadrant of left female breast: Secondary | ICD-10-CM

## 2022-05-12 DIAGNOSIS — Z170 Estrogen receptor positive status [ER+]: Secondary | ICD-10-CM

## 2022-05-12 DIAGNOSIS — Z86718 Personal history of other venous thrombosis and embolism: Secondary | ICD-10-CM | POA: Diagnosis not present

## 2022-05-12 DIAGNOSIS — Z86711 Personal history of pulmonary embolism: Secondary | ICD-10-CM | POA: Diagnosis not present

## 2022-05-12 DIAGNOSIS — Z8042 Family history of malignant neoplasm of prostate: Secondary | ICD-10-CM

## 2022-05-12 DIAGNOSIS — Z7901 Long term (current) use of anticoagulants: Secondary | ICD-10-CM | POA: Insufficient documentation

## 2022-05-12 DIAGNOSIS — F17210 Nicotine dependence, cigarettes, uncomplicated: Secondary | ICD-10-CM | POA: Insufficient documentation

## 2022-05-12 DIAGNOSIS — Z853 Personal history of malignant neoplasm of breast: Secondary | ICD-10-CM | POA: Diagnosis not present

## 2022-05-12 NOTE — Consult Note (Signed)
NEW PATIENT EVALUATION  Name: Maria Young  MRN: [id number]  Date:   05/12/2022     DOB: 1976/08/27   This 46 y.o. female patient presents to the clinic for initial evaluation of stage Ia (T1b N0 M0) ER/PR positive invasive mammary carcinoma of the left breast status post wide local excision and sentinel node biopsy.  REFERRING PHYSICIAN: Gladstone Lighter, MD  CHIEF COMPLAINT: No chief complaint on file.   DIAGNOSIS: The encounter diagnosis was Carcinoma of upper-outer quadrant of left breast in female, estrogen receptor positive ([place]).   PREVIOUS INVESTIGATIONS:  Mammogram ultrasound reviewed Clinical notes reviewed Pathology reports reviewed  HPI: Patient is a 46 year old female who presented with an abnormal mammogram of her left breast showing an 8 mm area in the posterior third of the lateral aspect of the breast.  She underwent targeted ultrasound guided biopsy showing invasive mammary carcinoma with mucinous features.  She then underwent wide local excision showing an 8 mm overall grade 1 invasive mammary carcinoma with margins clear at 2 mm.  4 sentinel lymph nodes were examined all negative for metastatic disease.  Tumor was ER/PR positive HER2/neu not overexpressed.  She has been seen by medical oncology will not have systemic therapy.  She is seen today for ration collagen opinion.  She is doing well.  She specifically denies breast tenderness cough or bone pain.  She her incisions are healing well.  PLANNED TREATMENT REGIMEN: Hypofractionated left whole breast radiation  PAST MEDICAL HISTORY:  has a past medical history of Breast cancer ([place]) (04/13/2022), DVT (deep venous thrombosis) ([place]) (01/26/2021), Heart murmur, Iron deficiency anemia, PE (pulmonary thromboembolism) ([place]) (06/11/2020), and Pneumonia.    PAST SURGICAL HISTORY:  Past Surgical History:  Procedure Laterality Date   ANKLE FRACTURE SURGERY Right 2005   internal fixation   BREAST BIOPSY Left  04/13/2022   [place] Core bx coil clip-path pending   ORIF WRIST FRACTURE Left 10/30/2019   Procedure: OPEN REDUCTION INTERNAL FIXATION (ORIF) WRIST FRACTURE with percutaneous screw;  Surgeon: Corky Mull, MD;  Location: [facility] ORS;  Service: Orthopedics;  Laterality: Left;   PARTIAL MASTECTOMY WITH AXILLARY SENTINEL LYMPH NODE BIOPSY Left 04/29/2022   Procedure: PARTIAL MASTECTOMY WITH AXILLARY SENTINEL LYMPH NODE BIOPSY;  Surgeon: Benjamine Sprague, DO;  Location: [facility] ORS;  Service: General;  Laterality: Left;    FAMILY HISTORY: family history includes Breast cancer in her cousin, maternal great-grandmother, and another family member; Diabetes in her father; Heart disease in her father; Hyperlipidemia in her brother; Prostate cancer in her paternal grandfather.  SOCIAL HISTORY:  reports that she has been smoking cigarettes. She has been smoking an average of .5 packs per day. She has never used smokeless tobacco. She reports current alcohol use of about 14.0 standard drinks of alcohol per week. She reports that she does not currently use drugs after having used the following drugs: Marijuana.  ALLERGIES: Azithromycin  MEDICATIONS:  Current Outpatient Medications  Medication Sig Dispense Refill   acetaminophen (TYLENOL) 325 MG tablet Take 2 tablets (650 mg total) by mouth every 8 (eight) hours as needed for mild pain. 40 tablet 0   apixaban (ELIQUIS) 2.5 MG TABS tablet Take 2.5 mg by mouth 2 (two) times daily.     HYDROcodone-acetaminophen (NORCO) 5-325 MG tablet Take 1 tablet by mouth every 6 (six) hours as needed for up to 6 doses for moderate pain. (Patient not taking: Reported on 05/12/2022) 6 tablet 0   oxyCODONE-acetaminophen (PERCOCET) 5-325 MG tablet Take 1 tablet by  mouth every 8 (eight) hours as needed for up to 6 doses for severe pain. (Patient not taking: Reported on 05/12/2022) 6 tablet 0   No current facility-administered medications for this encounter.    ECOG PERFORMANCE STATUS:  0 -  Asymptomatic  REVIEW OF SYSTEMS: Patient denies any weight loss, fatigue, weakness, fever, chills or night sweats. Patient denies any loss of vision, blurred vision. Patient denies any ringing  of the ears or hearing loss. No irregular heartbeat. Patient denies heart murmur or history of fainting. Patient denies any chest pain or pain radiating to her upper extremities. Patient denies any shortness of breath, difficulty breathing at night, cough or hemoptysis. Patient denies any swelling in the lower legs. Patient denies any nausea vomiting, vomiting of blood, or coffee ground material in the vomitus. Patient denies any stomach pain. Patient states has had normal bowel movements no significant constipation or diarrhea. Patient denies any dysuria, hematuria or significant nocturia. Patient denies any problems walking, swelling in the joints or loss of balance. Patient denies any skin changes, loss of hair or loss of weight. Patient denies any excessive worrying or anxiety or significant depression. Patient denies any problems with insomnia. Patient denies excessive thirst, polyuria, polydipsia. Patient denies any swollen glands, patient denies easy bruising or easy bleeding. Patient denies any recent infections, allergies or URI. Patient "s visual fields have not changed significantly in recent time.   PHYSICAL EXAM: LMP 04/04/2022 (Exact Date) Comment: Negative urine pregnancy test 04/29/2022 Left breast is wide local excision and sentinel node scars both healing well she has another incision in the inferior portion of the left breast slightly erythematous I have asked her to start using some antibiotic cream on that.  No dominant masses noted in either breast.  No axillary or supraclavicular adenopathy is appreciated.  Well-developed well-nourished patient in NAD. HEENT reveals PERLA, EOMI, discs not visualized.  Oral cavity is clear. No oral mucosal lesions are identified. Neck is clear without evidence of  cervical or supraclavicular adenopathy. Lungs are clear to A&P. Cardiac examination is essentially unremarkable with regular rate and rhythm without murmur rub or thrill. Abdomen is benign with no organomegaly or masses noted. Motor sensory and DTR levels are equal and symmetric in the upper and lower extremities. Cranial nerves II through XII are grossly intact. Proprioception is intact. No peripheral adenopathy or edema is identified. No motor or sensory levels are noted. Crude visual fields are within normal range.  LABORATORY DATA: Pathology reports reviewed    RADIOLOGY RESULTS: Mammograms and ultrasound reviewed compatible with above-stated findings   IMPRESSION: Stage Ia (T1b N0 M0) ER/PR positive invasive mammary carcinoma with mucinous features of the left breast status post wide local excision and sentinel node biopsy in 46 year old female  PLAN: At this time I have recommended hypofractionated course of whole breast radiation over 3 weeks would also boost her scar another 1000 cGy using electron beam.  Risks and benefits of treatment including skin reaction fatigue alteration of blood counts possible inclusion of superficial lung all were discussed in detail with the patient.  She comprehends my treatment plan well.  She also will be candidate for endocrine therapy after completion of radiation.  I have personally set up and ordered CT simulation.  I would like to take this opportunity to thank you for allowing me to participate in the care of your patient.Noreene Filbert, MD

## 2022-05-12 NOTE — Progress Notes (Signed)
REFERRING PROVIDER: Lloyd Huger, MD [address],  [place] [address]  PRIMARY PROVIDER:  Gladstone Lighter, MD  PRIMARY REASON FOR VISIT:  1. Carcinoma of upper-outer quadrant of left breast in female, estrogen receptor positive ([place])   2. Family history of breast cancer   3. Family history of prostate cancer      HISTORY OF PRESENT ILLNESS:   Maria Young, a 46 y.o. female, was seen for a [place] cancer genetics consultation at the request of Dr. Grayland Ormond due to a personal and family history of cancer.  Maria Young presents to clinic today to discuss the possibility of a hereditary predisposition to cancer, genetic testing, and to further clarify her future cancer risks, as well as potential cancer risks for family members.   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 The treatment plan includes lumpectomy (completed 6/2), oncotype, adjuvant radiation and tamoxifen.   CANCER HISTORY:  Oncology History  Carcinoma of upper-outer quadrant of left breast in female, estrogen receptor positive ([place])  04/17/2022 Initial Diagnosis   Carcinoma of upper-outer quadrant of left breast in female, estrogen receptor positive ([place])   04/17/2022 Cancer Staging   Staging form: Breast, AJCC 8th Edition - Clinical stage from 04/17/2022: Stage IA (cT1b, cN0, cM0, G1, ER+, PR+, HER2-) - Signed by Lloyd Huger, MD on 04/17/2022 Stage prefix: Initial diagnosis Histologic grading system: 3 grade system      RISK FACTORS:  Menarche was at age 70.  First live birth at age 89.  OCP use for approximately 0 years.  Ovaries intact: yes.  Hysterectomy: no.  Menopausal status: premenopausal.  HRT use: 0 years. Colonoscopy: no;  Cologuard normal . Mammogram within the last year: yes. Number of breast biopsies: 1  Past Medical History:  Diagnosis Date   Breast cancer ([place]) 04/13/2022    left (estrogen receptor positive)   DVT (deep venous thrombosis) ([place]) 01/26/2021   left leg   Heart murmur    Iron deficiency anemia    PE (pulmonary thromboembolism) ([place]) 06/11/2020   Pneumonia     Past Surgical History:  Procedure Laterality Date   ANKLE FRACTURE SURGERY Right 2005   internal fixation   BREAST BIOPSY Left 04/13/2022   [place] Core bx coil clip-path pending   ORIF WRIST FRACTURE Left 10/30/2019   Procedure: OPEN REDUCTION INTERNAL FIXATION (ORIF) WRIST FRACTURE with percutaneous screw;  Surgeon: Corky Mull, MD;  Location: [facility] ORS;  Service: Orthopedics;  Laterality: Left;   PARTIAL MASTECTOMY WITH AXILLARY SENTINEL LYMPH NODE BIOPSY Left 04/29/2022   Procedure: PARTIAL MASTECTOMY WITH AXILLARY SENTINEL LYMPH NODE BIOPSY;  Surgeon: Benjamine Sprague, DO;  Location: [facility] ORS;  Service: General;  Laterality: Left;    FAMILY HISTORY:  We obtained a detailed, 4-generation family history.  Significant diagnoses are listed below: Family History  Problem Relation Age of Onset   Diabetes Father    Heart disease Father    Hyperlipidemia Brother    Prostate cancer Paternal Grandfather        d. 80s   Breast cancer Other    Breast cancer Cousin        dx 30s   Breast cancer Maternal Great-grandmother    Maria Young has 1 daughter, 65. She has 1 maternal half brother.   Maria Young mother is living in her 8s, patient has limited contact with her. Maternal grandmother's mother and  sister both had breast cancer, unknown ages of diagnosis.   Maria Young father passed in his mid 49s and had no siblings. Patient's paternal grandfather had prostate cancer that he passed from in his 44s. Patient's father has a double first cousin who had breast cancer in her 22s.   Maria Young is unaware of previous family history of genetic testing for hereditary cancer risks. There is no reported Ashkenazi Jewish ancestry. There is no known consanguinity.    GENETIC COUNSELING  ASSESSMENT: Maria Young is a 46 y.o. female with a personal and family history of breast cancer which is somewhat suggestive of a hereditary cancer syndrome and predisposition to cancer. We, therefore, discussed and recommended the following at today's visit.   DISCUSSION: We discussed that approximately 10% of breast cancer is hereditary. Most cases of hereditary breast cancer are associated with BRCA1/BRCA2 genes, although there are other genes associated with hereditary cancer as well. Cancers and risks are gene specific. We discussed that testing is beneficial for several reasons including knowing about cancer risks, identifying potential screening and risk-reduction options that may be appropriate, and to understand if other family members could be at risk for cancer and allow them to undergo genetic testing.   We reviewed the characteristics, features and inheritance patterns of hereditary cancer syndromes. We also discussed genetic testing, including the appropriate family members to test, the process of testing, insurance coverage and turn-around-time for results. We discussed the implications of a negative, positive and/or variant of uncertain significant result. We recommended Maria Young pursue genetic testing for the [facility] CancerNext-Expanded+RNA gene panel.   The CancerNext-Expanded + RNAinsight gene panel offered by [employer] and includes sequencing and rearrangement analysis for the following 77 genes: IP, ALK, APC*, ATM*, AXIN2, BAP1, BARD1, BLM, BMPR1A, BRCA1*, BRCA2*, BRIP1*, CDC73, CDH1*,CDK4, CDKN1B, CDKN2A, CHEK2*, CTNNA1, DICER1, FANCC, FH, FLCN, GALNT12, KIF1B, LZTR1, MAX, MEN1, MET, MLH1*, MSH2*, MSH3, MSH6*, MUTYH*, NBN, NF1*, NF2, NTHL1, PALB2*, PHOX2B, PMS2*, POT1, PRKAR1A, PTCH1, PTEN*, RAD51C*, RAD51D*,RB1, RECQL, RET, SDHA, SDHAF2, SDHB, SDHC, SDHD, SMAD4, SMARCA4, SMARCB1, SMARCE1, STK11, SUFU, TMEM127, TP53*,TSC1, TSC2, VHL and XRCC2 (sequencing and deletion/duplication);  EGFR, EGLN1, HOXB13, KIT, MITF, PDGFRA, POLD1 and POLE (sequencing only); EPCAM and GREM1 (deletion/duplication only).  Based on Maria Young's personal and family history of cancer, she meets medical criteria for genetic testing. Despite that she meets criteria, she may still have an out of pocket cost. We discussed that if her out of pocket cost for testing is over $100, the laboratory will call and confirm whether she wants to proceed with testing.  If the out of pocket cost of testing is less than $100 she will be billed by the genetic testing laboratory.   PLAN: After considering the risks, benefits, and limitations, Maria Young provided informed consent to pursue genetic testing and the blood sample was sent to [facility] for analysis of the CancerNext-Expanded+RNA panel. Results should be available within approximately 2-3 weeks' time, at which point they will be disclosed by telephone to Maria Young, as will any additional recommendations warranted by these results. Maria Young will receive a summary of her genetic counseling visit and a copy of her results once available. This information will also be available in Epic.   Maria Young questions were answered to her satisfaction today. Our contact information was provided should additional questions or concerns arise. Thank you for the referral and allowing [place] to share in the care of your patient.   Faith Rogue, MS, [facility] Genetic Counselor [email or web address]  Phone: [phone]  The patient was seen for a total of 25 minutes in face-to-face genetic counseling.  Her husband Ysidro Evert was also present. Dr. Grayland Ormond was available for discussion regarding this case.   _______________________________________________________________________ For Office Staff:  Number of people involved in session: 2 Was an Intern/ student involved with case: no

## 2022-05-20 ENCOUNTER — Telehealth: Payer: Self-pay | Admitting: *Deleted

## 2022-05-20 NOTE — Telephone Encounter (Signed)
Oncotype lab called stating that they got order to cancel testing due to lack of specimen, but they said that they received a specimen on 6/20 and are asking if we want them to rum testing on that or not. Please advise

## 2022-05-26 ENCOUNTER — Encounter: Payer: Self-pay | Admitting: Oncology

## 2022-05-30 ENCOUNTER — Telehealth: Payer: Self-pay | Admitting: Licensed Clinical Social Worker

## 2022-05-30 ENCOUNTER — Ambulatory Visit: Payer: Self-pay | Admitting: Licensed Clinical Social Worker

## 2022-05-30 ENCOUNTER — Encounter: Payer: Self-pay | Admitting: Licensed Clinical Social Worker

## 2022-05-30 DIAGNOSIS — Z1379 Encounter for other screening for genetic and chromosomal anomalies: Secondary | ICD-10-CM

## 2022-05-30 NOTE — Telephone Encounter (Signed)
Revealed negative genetic testing.  Revealed that a VUS in BRIP1 was identified. This normal result is reassuring and indicates that it is unlikely [name]'s cancer is due to a hereditary cause.  It is unlikely that there is an increased risk of another cancer due to a mutation in one of these genes.  However, genetic testing is not perfect, and cannot definitively rule out a hereditary cause.  It will be important for her to keep in contact with genetics to learn if any additional testing may be needed in the future.

## 2022-05-30 NOTE — Progress Notes (Signed)
HPI:  [name] was previously seen in the [facility] due to a personal and family history of breast cancer and concerns regarding a hereditary predisposition to cancer. Please refer to our prior cancer genetics clinic note for more information regarding our discussion, assessment and recommendations, at the time. [name] recent genetic test results were disclosed to her, as were recommendations warranted by these results. These results and recommendations are discussed in more detail below.  CANCER HISTORY:  Oncology History  Carcinoma of upper-outer quadrant of left breast in female, estrogen receptor positive ([place])  04/17/2022 Initial Diagnosis   Carcinoma of upper-outer quadrant of left breast in female, estrogen receptor positive ([place])   04/17/2022 Cancer Staging   Staging form: Breast, AJCC 8th Edition - Clinical stage from 04/17/2022: Stage IA (cT1b, cN0, cM0, G1, ER+, PR+, HER2-) - Signed by Lloyd Huger, MD on 04/17/2022 Stage prefix: Initial diagnosis Histologic grading system: 3 grade system    Genetic Testing   Negative genetic testing. No pathogenic variants identified on the [facility] CancerNext-Expanded+RNA panel. The report date is 05/26/2022.  The CancerNext-Expanded + RNAinsight gene panel offered by [employer] and includes sequencing and rearrangement analysis for the following 77 genes: IP, ALK, APC*, ATM*, AXIN2, BAP1, BARD1, BLM, BMPR1A, BRCA1*, BRCA2*, BRIP1*, CDC73, CDH1*,CDK4, CDKN1B, CDKN2A, CHEK2*, CTNNA1, DICER1, FANCC, FH, FLCN, GALNT12, KIF1B, LZTR1, MAX, MEN1, MET, MLH1*, MSH2*, MSH3, MSH6*, MUTYH*, NBN, NF1*, NF2, NTHL1, PALB2*, PHOX2B, PMS2*, POT1, PRKAR1A, PTCH1, PTEN*, RAD51C*, RAD51D*,RB1, RECQL, RET, SDHA, SDHAF2, SDHB, SDHC, SDHD, SMAD4, SMARCA4, SMARCB1, SMARCE1, STK11, SUFU, TMEM127, TP53*,TSC1, TSC2, VHL and XRCC2 (sequencing and deletion/duplication); EGFR, EGLN1, HOXB13, KIT, MITF, PDGFRA, POLD1 and POLE (sequencing only);  EPCAM and GREM1 (deletion/duplication only).     FAMILY HISTORY:  We obtained a detailed, 4-generation family history.  Significant diagnoses are listed below: Family History  Problem Relation Age of Onset   Diabetes Father    Heart disease Father    Hyperlipidemia Brother    Prostate cancer Paternal Grandfather        d. 68s   Breast cancer Other    Breast cancer Cousin        dx 27s   Breast cancer Maternal Great-grandmother    [name] has 1 daughter, 27. She has 1 maternal half brother.    [name] mother is living in her 15s, patient has limited contact with her. Maternal grandmother's mother and sister both had breast cancer, unknown ages of diagnosis.    [name] father passed in his mid 56s and had no siblings. Patient's paternal grandfather had prostate cancer that he passed from in his 23s. Patient's father has a double first cousin who had breast cancer in her 57s.    [name] is unaware of previous family history of genetic testing for hereditary cancer risks. There is no reported Ashkenazi Jewish ancestry. There is no known consanguinity.      GENETIC TEST RESULTS: Genetic testing reported out on 05/26/2022 through the [facility] CancerNext-Expanded+RNA cancer panel found no pathogenic mutations.   The CancerNext-Expanded + RNAinsight gene panel offered by [employer] and includes sequencing and rearrangement analysis for the following 77 genes: IP, ALK, APC*, ATM*, AXIN2, BAP1, BARD1, BLM, BMPR1A, BRCA1*, BRCA2*, BRIP1*, CDC73, CDH1*,CDK4, CDKN1B, CDKN2A, CHEK2*, CTNNA1, DICER1, FANCC, FH, FLCN, GALNT12, KIF1B, LZTR1, MAX, MEN1, MET, MLH1*, MSH2*, MSH3, MSH6*, MUTYH*, NBN, NF1*, NF2, NTHL1, PALB2*, PHOX2B, PMS2*, POT1, PRKAR1A, PTCH1, PTEN*, RAD51C*, RAD51D*,RB1, RECQL, RET, SDHA, SDHAF2, SDHB, SDHC, SDHD, SMAD4, SMARCA4,  SMARCB1, SMARCE1, STK11, SUFU, TMEM127, TP53*,TSC1, TSC2, VHL and XRCC2 (sequencing and deletion/duplication); EGFR, EGLN1, HOXB13, KIT,  MITF, PDGFRA, POLD1 and POLE (sequencing only); EPCAM and GREM1 (deletion/duplication only).   The test report has been scanned into EPIC and is located under the Molecular Pathology section of the Results Review tab.  A portion of the result report is included below for reference.     We discussed that because current genetic testing is not perfect, it is possible there may be a gene mutation in one of these genes that current testing cannot detect, but that chance is small.  There could be another gene that has not yet been discovered, or that we have not yet tested, that is responsible for the cancer diagnoses in the family. It is also possible there is a hereditary cause for the cancer in the family that [name] did not inherit and therefore was not identified in her testing.  Therefore, it is important to remain in touch with cancer genetics in the future so that we can continue to offer [name] the most up to date genetic testing.   Genetic testing did identify a variant of uncertain significance (VUS) in the BRIP1 gene called [id number].I941L.  At this time, it is unknown if this variant is associated with increased cancer risk or if this is a normal finding, but most variants such as this get reclassified to being inconsequential. It should not be used to make medical management decisions. With time, we suspect the lab will determine the significance of this variant, if any. If we do learn more about it we will try to contact [name] to discuss it further. However, it is important to stay in touch with [place] periodically and keep the address and phone number up to date.  ADDITIONAL GENETIC TESTING: We discussed with [name] that her genetic testing was fairly extensive.  If there are genes identified to increase cancer risk that can be analyzed in the future, we would be happy to discuss and coordinate this testing at that time.    CANCER SCREENING RECOMMENDATIONS: [name] test result  is considered negative (normal).  This means that we have not identified a hereditary cause for her  personal and family history of cancer at this time. Most cancers happen by chance and this negative test suggests that her cancer may fall into this category.    While reassuring, this does not definitively rule out a hereditary predisposition to cancer. It is still possible that there could be genetic mutations that are undetectable by current technology. There could be genetic mutations in genes that have not been tested or identified to increase cancer risk.  Therefore, it is recommended she continue to follow the cancer management and screening guidelines provided by her oncology and primary healthcare provider.   An individual's cancer risk and medical management are not determined by genetic test results alone. Overall cancer risk assessment incorporates additional factors, including personal medical history, family history, and any available genetic information that may result in a personalized plan for cancer prevention and surveillance.  RECOMMENDATIONS FOR FAMILY MEMBERS:  Relatives in this family might be at some increased risk of developing cancer, over the general population risk, simply due to the family history of cancer.  We recommended female relatives in this family have a yearly mammogram beginning at age 85, or 6 years younger than the earliest onset of cancer, an annual clinical breast exam, and perform monthly breast self-exams. Female relatives  in this family should also have a gynecological exam as recommended by their primary provider.  All family members should be referred for colonoscopy starting at age 6.    It is also possible there is a hereditary cause for the cancer in [name] family that she did not inherit and therefore was not identified in her.  Based on [name]'s family history, we recommended her paternal relative who had breast cancer in her 46s have genetic  counseling and testing. [name] will let us know if we can be of any assistance in coordinating genetic counseling and/or testing for these family members.  FOLLOW-UP: Lastly, we discussed with [name] that cancer genetics is a rapidly advancing field and it is possible that new genetic tests will be appropriate for her and/or her family members in the future. We encouraged her to remain in contact with cancer genetics on an annual basis so we can update her personal and family histories and let her know of advances in cancer genetics that may benefit this family.   Our contact number was provided. [name] questions were answered to her satisfaction, and she knows she is welcome to call us at anytime with additional questions or concerns.   Faith Rogue, MS, [facility] Genetic Counselor [email or web address] Phone: [phone]

## 2022-06-06 ENCOUNTER — Ambulatory Visit: Payer: BC Managed Care – PPO

## 2022-06-08 ENCOUNTER — Ambulatory Visit: Payer: BC Managed Care – PPO

## 2022-06-09 ENCOUNTER — Encounter: Payer: Self-pay | Admitting: *Deleted

## 2022-06-09 ENCOUNTER — Ambulatory Visit
Admission: RE | Admit: 2022-06-09 | Discharge: 2022-06-09 | Disposition: A | Discharge status: Home / Self Care | Payer: BC Managed Care – PPO | Source: Ambulatory Visit | Attending: Radiation Oncology | Admitting: Radiation Oncology

## 2022-06-09 DIAGNOSIS — Z170 Estrogen receptor positive status [ER+]: Secondary | ICD-10-CM | POA: Insufficient documentation

## 2022-06-09 DIAGNOSIS — Z510 Encounter for antineoplastic radiation therapy: Secondary | ICD-10-CM | POA: Insufficient documentation

## 2022-06-09 DIAGNOSIS — C50412 Malignant neoplasm of upper-outer quadrant of left female breast: Secondary | ICD-10-CM | POA: Diagnosis present

## 2022-06-13 DIAGNOSIS — C50412 Malignant neoplasm of upper-outer quadrant of left female breast: Secondary | ICD-10-CM | POA: Diagnosis not present

## 2022-06-17 ENCOUNTER — Other Ambulatory Visit: Payer: Self-pay | Admitting: *Deleted

## 2022-06-17 DIAGNOSIS — Z170 Estrogen receptor positive status [ER+]: Secondary | ICD-10-CM

## 2022-06-20 ENCOUNTER — Ambulatory Visit: Admission: RE | Admit: 2022-06-20 | Payer: BC Managed Care – PPO | Source: Ambulatory Visit

## 2022-06-20 DIAGNOSIS — C50412 Malignant neoplasm of upper-outer quadrant of left female breast: Secondary | ICD-10-CM | POA: Diagnosis not present

## 2022-06-21 ENCOUNTER — Ambulatory Visit
Admission: RE | Admit: 2022-06-21 | Discharge: 2022-06-21 | Disposition: A | Discharge status: Home / Self Care | Payer: BC Managed Care – PPO | Source: Ambulatory Visit | Attending: Radiation Oncology | Admitting: Radiation Oncology

## 2022-06-21 ENCOUNTER — Other Ambulatory Visit: Payer: Self-pay

## 2022-06-21 DIAGNOSIS — C50412 Malignant neoplasm of upper-outer quadrant of left female breast: Secondary | ICD-10-CM | POA: Diagnosis not present

## 2022-06-21 LAB — RAD ONC ARIA SESSION SUMMARY
Course Elapsed Days: 0
Plan Fractions Treated to Date: 1
Plan Prescribed Dose Per Fraction: 2.66 Gy
Plan Total Fractions Prescribed: 16
Plan Total Prescribed Dose: 42.56 Gy
Reference Point Dosage Given to Date: 2.66 Gy
Reference Point Session Dosage Given: 2.66 Gy
Session Number: 1

## 2022-06-22 ENCOUNTER — Other Ambulatory Visit: Payer: Self-pay

## 2022-06-22 ENCOUNTER — Ambulatory Visit
Admission: RE | Admit: 2022-06-22 | Discharge: 2022-06-22 | Disposition: A | Discharge status: Home / Self Care | Payer: BC Managed Care – PPO | Source: Ambulatory Visit | Attending: Radiation Oncology | Admitting: Radiation Oncology

## 2022-06-22 DIAGNOSIS — C50412 Malignant neoplasm of upper-outer quadrant of left female breast: Secondary | ICD-10-CM | POA: Diagnosis not present

## 2022-06-22 LAB — RAD ONC ARIA SESSION SUMMARY
Course Elapsed Days: 1
Plan Fractions Treated to Date: 2
Plan Prescribed Dose Per Fraction: 2.66 Gy
Plan Total Fractions Prescribed: 16
Plan Total Prescribed Dose: 42.56 Gy
Reference Point Dosage Given to Date: 5.32 Gy
Reference Point Session Dosage Given: 2.66 Gy
Session Number: 2

## 2022-06-23 ENCOUNTER — Ambulatory Visit
Admission: RE | Admit: 2022-06-23 | Discharge: 2022-06-23 | Disposition: A | Discharge status: Home / Self Care | Payer: BC Managed Care – PPO | Source: Ambulatory Visit | Attending: Radiation Oncology | Admitting: Radiation Oncology

## 2022-06-23 ENCOUNTER — Other Ambulatory Visit: Payer: Self-pay

## 2022-06-23 DIAGNOSIS — C50412 Malignant neoplasm of upper-outer quadrant of left female breast: Secondary | ICD-10-CM | POA: Diagnosis not present

## 2022-06-23 LAB — RAD ONC ARIA SESSION SUMMARY
Course Elapsed Days: 2
Plan Fractions Treated to Date: 3
Plan Prescribed Dose Per Fraction: 2.66 Gy
Plan Total Fractions Prescribed: 16
Plan Total Prescribed Dose: 42.56 Gy
Reference Point Dosage Given to Date: 7.98 Gy
Reference Point Session Dosage Given: 2.66 Gy
Session Number: 3

## 2022-06-24 ENCOUNTER — Other Ambulatory Visit: Payer: Self-pay

## 2022-06-24 ENCOUNTER — Ambulatory Visit
Admission: RE | Admit: 2022-06-24 | Discharge: 2022-06-24 | Disposition: A | Discharge status: Home / Self Care | Payer: BC Managed Care – PPO | Source: Ambulatory Visit | Attending: Radiation Oncology | Admitting: Radiation Oncology

## 2022-06-24 DIAGNOSIS — C50412 Malignant neoplasm of upper-outer quadrant of left female breast: Secondary | ICD-10-CM | POA: Diagnosis not present

## 2022-06-24 LAB — RAD ONC ARIA SESSION SUMMARY
Course Elapsed Days: 3
Plan Fractions Treated to Date: 4
Plan Prescribed Dose Per Fraction: 2.66 Gy
Plan Total Fractions Prescribed: 16
Plan Total Prescribed Dose: 42.56 Gy
Reference Point Dosage Given to Date: 10.64 Gy
Reference Point Session Dosage Given: 2.66 Gy
Session Number: 4

## 2022-06-27 ENCOUNTER — Ambulatory Visit
Admission: RE | Admit: 2022-06-27 | Discharge: 2022-06-27 | Disposition: A | Discharge status: Home / Self Care | Payer: BC Managed Care – PPO | Source: Ambulatory Visit | Attending: Radiation Oncology | Admitting: Radiation Oncology

## 2022-06-27 ENCOUNTER — Other Ambulatory Visit: Payer: Self-pay

## 2022-06-27 DIAGNOSIS — C50412 Malignant neoplasm of upper-outer quadrant of left female breast: Secondary | ICD-10-CM | POA: Diagnosis not present

## 2022-06-27 LAB — RAD ONC ARIA SESSION SUMMARY
Course Elapsed Days: 6
Plan Fractions Treated to Date: 5
Plan Prescribed Dose Per Fraction: 2.66 Gy
Plan Total Fractions Prescribed: 16
Plan Total Prescribed Dose: 42.56 Gy
Reference Point Dosage Given to Date: 13.3 Gy
Reference Point Session Dosage Given: 2.66 Gy
Session Number: 5

## 2022-06-28 ENCOUNTER — Ambulatory Visit
Admission: RE | Admit: 2022-06-28 | Discharge: 2022-06-28 | Disposition: A | Discharge status: Home / Self Care | Payer: BC Managed Care – PPO | Source: Ambulatory Visit | Attending: Radiation Oncology | Admitting: Radiation Oncology

## 2022-06-28 ENCOUNTER — Other Ambulatory Visit: Payer: Self-pay

## 2022-06-28 DIAGNOSIS — C50412 Malignant neoplasm of upper-outer quadrant of left female breast: Secondary | ICD-10-CM | POA: Insufficient documentation

## 2022-06-28 DIAGNOSIS — Z510 Encounter for antineoplastic radiation therapy: Secondary | ICD-10-CM | POA: Diagnosis present

## 2022-06-28 DIAGNOSIS — Z170 Estrogen receptor positive status [ER+]: Secondary | ICD-10-CM | POA: Insufficient documentation

## 2022-06-28 LAB — RAD ONC ARIA SESSION SUMMARY
Course Elapsed Days: 7
Plan Fractions Treated to Date: 6
Plan Prescribed Dose Per Fraction: 2.66 Gy
Plan Total Fractions Prescribed: 16
Plan Total Prescribed Dose: 42.56 Gy
Reference Point Dosage Given to Date: 15.96 Gy
Reference Point Session Dosage Given: 2.66 Gy
Session Number: 6

## 2022-06-29 ENCOUNTER — Ambulatory Visit
Admission: RE | Admit: 2022-06-29 | Discharge: 2022-06-29 | Disposition: A | Discharge status: Home / Self Care | Payer: BC Managed Care – PPO | Source: Ambulatory Visit | Attending: Radiation Oncology | Admitting: Radiation Oncology

## 2022-06-29 ENCOUNTER — Other Ambulatory Visit: Payer: Self-pay

## 2022-06-29 DIAGNOSIS — C50412 Malignant neoplasm of upper-outer quadrant of left female breast: Secondary | ICD-10-CM | POA: Diagnosis not present

## 2022-06-29 LAB — RAD ONC ARIA SESSION SUMMARY
Course Elapsed Days: 8
Plan Fractions Treated to Date: 7
Plan Prescribed Dose Per Fraction: 2.66 Gy
Plan Total Fractions Prescribed: 16
Plan Total Prescribed Dose: 42.56 Gy
Reference Point Dosage Given to Date: 18.62 Gy
Reference Point Session Dosage Given: 2.66 Gy
Session Number: 7

## 2022-06-30 ENCOUNTER — Other Ambulatory Visit: Payer: Self-pay

## 2022-06-30 ENCOUNTER — Ambulatory Visit
Admission: RE | Admit: 2022-06-30 | Discharge: 2022-06-30 | Disposition: A | Discharge status: Home / Self Care | Payer: BC Managed Care – PPO | Source: Ambulatory Visit | Attending: Radiation Oncology | Admitting: Radiation Oncology

## 2022-06-30 DIAGNOSIS — C50412 Malignant neoplasm of upper-outer quadrant of left female breast: Secondary | ICD-10-CM | POA: Diagnosis not present

## 2022-06-30 LAB — RAD ONC ARIA SESSION SUMMARY
Course Elapsed Days: 9
Plan Fractions Treated to Date: 8
Plan Prescribed Dose Per Fraction: 2.66 Gy
Plan Total Fractions Prescribed: 16
Plan Total Prescribed Dose: 42.56 Gy
Reference Point Dosage Given to Date: 21.28 Gy
Reference Point Session Dosage Given: 2.66 Gy
Session Number: 8

## 2022-07-01 ENCOUNTER — Other Ambulatory Visit: Payer: Self-pay

## 2022-07-01 ENCOUNTER — Ambulatory Visit
Admission: RE | Admit: 2022-07-01 | Discharge: 2022-07-01 | Disposition: A | Discharge status: Home / Self Care | Payer: BC Managed Care – PPO | Source: Ambulatory Visit | Attending: Radiation Oncology | Admitting: Radiation Oncology

## 2022-07-01 DIAGNOSIS — C50412 Malignant neoplasm of upper-outer quadrant of left female breast: Secondary | ICD-10-CM | POA: Diagnosis not present

## 2022-07-01 LAB — RAD ONC ARIA SESSION SUMMARY
Course Elapsed Days: 10
Plan Fractions Treated to Date: 9
Plan Prescribed Dose Per Fraction: 2.66 Gy
Plan Total Fractions Prescribed: 16
Plan Total Prescribed Dose: 42.56 Gy
Reference Point Dosage Given to Date: 23.94 Gy
Reference Point Session Dosage Given: 2.66 Gy
Session Number: 9

## 2022-07-04 ENCOUNTER — Other Ambulatory Visit: Payer: Self-pay

## 2022-07-04 ENCOUNTER — Ambulatory Visit
Admission: RE | Admit: 2022-07-04 | Discharge: 2022-07-04 | Disposition: A | Discharge status: Home / Self Care | Payer: BC Managed Care – PPO | Source: Ambulatory Visit | Attending: Radiation Oncology | Admitting: Radiation Oncology

## 2022-07-04 ENCOUNTER — Inpatient Hospital Stay: Payer: BC Managed Care – PPO | Attending: Oncology

## 2022-07-04 DIAGNOSIS — C50412 Malignant neoplasm of upper-outer quadrant of left female breast: Secondary | ICD-10-CM | POA: Diagnosis not present

## 2022-07-04 DIAGNOSIS — Z170 Estrogen receptor positive status [ER+]: Secondary | ICD-10-CM

## 2022-07-04 LAB — RAD ONC ARIA SESSION SUMMARY
Course Elapsed Days: 13
Plan Fractions Treated to Date: 10
Plan Prescribed Dose Per Fraction: 2.66 Gy
Plan Total Fractions Prescribed: 16
Plan Total Prescribed Dose: 42.56 Gy
Reference Point Dosage Given to Date: 26.6 Gy
Reference Point Session Dosage Given: 2.66 Gy
Session Number: 10

## 2022-07-04 LAB — CBC
HCT: 35.2 % — ABNORMAL LOW (ref 36.0–46.0)
Hemoglobin: 11.1 g/dL — ABNORMAL LOW (ref 12.0–15.0)
MCH: 28.8 pg (ref 26.0–34.0)
MCHC: 31.5 g/dL (ref 30.0–36.0)
MCV: 91.2 fL (ref 80.0–100.0)
Platelets: 298 10*3/uL (ref 150–400)
RBC: 3.86 MIL/uL — ABNORMAL LOW (ref 3.87–5.11)
RDW: 15.6 % — ABNORMAL HIGH (ref 11.5–15.5)
WBC: 6.2 10*3/uL (ref 4.0–10.5)
nRBC: 0 % (ref 0.0–0.2)

## 2022-07-05 ENCOUNTER — Other Ambulatory Visit: Payer: Self-pay

## 2022-07-05 ENCOUNTER — Ambulatory Visit
Admission: RE | Admit: 2022-07-05 | Discharge: 2022-07-05 | Disposition: A | Discharge status: Home / Self Care | Payer: BC Managed Care – PPO | Source: Ambulatory Visit | Attending: Radiation Oncology | Admitting: Radiation Oncology

## 2022-07-05 DIAGNOSIS — C50412 Malignant neoplasm of upper-outer quadrant of left female breast: Secondary | ICD-10-CM | POA: Diagnosis not present

## 2022-07-05 LAB — RAD ONC ARIA SESSION SUMMARY
Course Elapsed Days: 14
Plan Fractions Treated to Date: 11
Plan Prescribed Dose Per Fraction: 2.66 Gy
Plan Total Fractions Prescribed: 16
Plan Total Prescribed Dose: 42.56 Gy
Reference Point Dosage Given to Date: 29.26 Gy
Reference Point Session Dosage Given: 2.66 Gy
Session Number: 11

## 2022-07-06 ENCOUNTER — Other Ambulatory Visit: Payer: Self-pay

## 2022-07-06 ENCOUNTER — Ambulatory Visit
Admission: RE | Admit: 2022-07-06 | Discharge: 2022-07-06 | Disposition: A | Discharge status: Home / Self Care | Payer: BC Managed Care – PPO | Source: Ambulatory Visit | Attending: Radiation Oncology | Admitting: Radiation Oncology

## 2022-07-06 DIAGNOSIS — C50412 Malignant neoplasm of upper-outer quadrant of left female breast: Secondary | ICD-10-CM | POA: Diagnosis not present

## 2022-07-06 LAB — RAD ONC ARIA SESSION SUMMARY
Course Elapsed Days: 15
Plan Fractions Treated to Date: 12
Plan Prescribed Dose Per Fraction: 2.66 Gy
Plan Total Fractions Prescribed: 16
Plan Total Prescribed Dose: 42.56 Gy
Reference Point Dosage Given to Date: 31.92 Gy
Reference Point Session Dosage Given: 2.66 Gy
Session Number: 12

## 2022-07-06 NOTE — Telephone Encounter (Signed)
Error

## 2022-07-07 ENCOUNTER — Other Ambulatory Visit: Payer: Self-pay

## 2022-07-07 ENCOUNTER — Ambulatory Visit
Admission: RE | Admit: 2022-07-07 | Discharge: 2022-07-07 | Disposition: A | Discharge status: Home / Self Care | Payer: BC Managed Care – PPO | Source: Ambulatory Visit | Attending: Radiation Oncology | Admitting: Radiation Oncology

## 2022-07-07 DIAGNOSIS — C50412 Malignant neoplasm of upper-outer quadrant of left female breast: Secondary | ICD-10-CM | POA: Diagnosis not present

## 2022-07-07 LAB — RAD ONC ARIA SESSION SUMMARY
Course Elapsed Days: 16
Plan Fractions Treated to Date: 13
Plan Prescribed Dose Per Fraction: 2.66 Gy
Plan Total Fractions Prescribed: 16
Plan Total Prescribed Dose: 42.56 Gy
Reference Point Dosage Given to Date: 34.58 Gy
Reference Point Session Dosage Given: 2.66 Gy
Session Number: 13

## 2022-07-08 ENCOUNTER — Other Ambulatory Visit: Payer: Self-pay

## 2022-07-08 ENCOUNTER — Ambulatory Visit
Admission: RE | Admit: 2022-07-08 | Discharge: 2022-07-08 | Disposition: A | Discharge status: Home / Self Care | Payer: BC Managed Care – PPO | Source: Ambulatory Visit | Attending: Radiation Oncology | Admitting: Radiation Oncology

## 2022-07-08 DIAGNOSIS — C50412 Malignant neoplasm of upper-outer quadrant of left female breast: Secondary | ICD-10-CM | POA: Diagnosis not present

## 2022-07-08 LAB — RAD ONC ARIA SESSION SUMMARY
Course Elapsed Days: 17
Plan Fractions Treated to Date: 14
Plan Prescribed Dose Per Fraction: 2.66 Gy
Plan Total Fractions Prescribed: 16
Plan Total Prescribed Dose: 42.56 Gy
Reference Point Dosage Given to Date: 37.24 Gy
Reference Point Session Dosage Given: 1.3912 Gy
Session Number: 14

## 2022-07-11 ENCOUNTER — Other Ambulatory Visit: Payer: Self-pay

## 2022-07-11 ENCOUNTER — Ambulatory Visit: Payer: BC Managed Care – PPO

## 2022-07-11 ENCOUNTER — Ambulatory Visit
Admission: RE | Admit: 2022-07-11 | Discharge: 2022-07-11 | Disposition: A | Discharge status: Home / Self Care | Payer: BC Managed Care – PPO | Source: Ambulatory Visit | Attending: Specialist | Admitting: Specialist

## 2022-07-11 ENCOUNTER — Ambulatory Visit
Admission: RE | Admit: 2022-07-11 | Discharge: 2022-07-11 | Disposition: A | Discharge status: Home / Self Care | Payer: BC Managed Care – PPO | Source: Ambulatory Visit | Attending: Radiation Oncology | Admitting: Radiation Oncology

## 2022-07-11 DIAGNOSIS — C50412 Malignant neoplasm of upper-outer quadrant of left female breast: Secondary | ICD-10-CM | POA: Diagnosis not present

## 2022-07-11 DIAGNOSIS — R918 Other nonspecific abnormal finding of lung field: Secondary | ICD-10-CM | POA: Diagnosis present

## 2022-07-11 LAB — RAD ONC ARIA SESSION SUMMARY
Course Elapsed Days: 20
Plan Fractions Treated to Date: 15
Plan Prescribed Dose Per Fraction: 2.66 Gy
Plan Total Fractions Prescribed: 16
Plan Total Prescribed Dose: 42.56 Gy
Reference Point Dosage Given to Date: 39.9 Gy
Reference Point Session Dosage Given: 2.66 Gy
Session Number: 15

## 2022-07-12 ENCOUNTER — Ambulatory Visit: Payer: BC Managed Care – PPO

## 2022-07-12 ENCOUNTER — Other Ambulatory Visit: Payer: Self-pay

## 2022-07-12 ENCOUNTER — Ambulatory Visit
Admission: RE | Admit: 2022-07-12 | Discharge: 2022-07-12 | Disposition: A | Discharge status: Home / Self Care | Payer: BC Managed Care – PPO | Source: Ambulatory Visit | Attending: Radiation Oncology | Admitting: Radiation Oncology

## 2022-07-12 DIAGNOSIS — C50412 Malignant neoplasm of upper-outer quadrant of left female breast: Secondary | ICD-10-CM | POA: Diagnosis not present

## 2022-07-12 LAB — RAD ONC ARIA SESSION SUMMARY
Course Elapsed Days: 21
Plan Fractions Treated to Date: 16
Plan Prescribed Dose Per Fraction: 2.66 Gy
Plan Total Fractions Prescribed: 16
Plan Total Prescribed Dose: 42.56 Gy
Reference Point Dosage Given to Date: 42.56 Gy
Reference Point Session Dosage Given: 2.66 Gy
Session Number: 16

## 2022-07-13 ENCOUNTER — Other Ambulatory Visit: Payer: Self-pay

## 2022-07-13 ENCOUNTER — Ambulatory Visit
Admission: RE | Admit: 2022-07-13 | Discharge: 2022-07-13 | Disposition: A | Discharge status: Home / Self Care | Payer: BC Managed Care – PPO | Source: Ambulatory Visit | Attending: Radiation Oncology | Admitting: Radiation Oncology

## 2022-07-13 DIAGNOSIS — C50412 Malignant neoplasm of upper-outer quadrant of left female breast: Secondary | ICD-10-CM | POA: Diagnosis not present

## 2022-07-13 LAB — RAD ONC ARIA SESSION SUMMARY
Course Elapsed Days: 22
Plan Fractions Treated to Date: 1
Plan Prescribed Dose Per Fraction: 2 Gy
Plan Total Fractions Prescribed: 5
Plan Total Prescribed Dose: 10 Gy
Reference Point Dosage Given to Date: 44.56 Gy
Reference Point Session Dosage Given: 2 Gy
Session Number: 17

## 2022-07-14 ENCOUNTER — Ambulatory Visit
Admission: RE | Admit: 2022-07-14 | Discharge: 2022-07-14 | Disposition: A | Discharge status: Home / Self Care | Payer: BC Managed Care – PPO | Source: Ambulatory Visit | Attending: Radiation Oncology | Admitting: Radiation Oncology

## 2022-07-14 ENCOUNTER — Other Ambulatory Visit: Payer: Self-pay

## 2022-07-14 DIAGNOSIS — C50412 Malignant neoplasm of upper-outer quadrant of left female breast: Secondary | ICD-10-CM | POA: Diagnosis not present

## 2022-07-14 LAB — RAD ONC ARIA SESSION SUMMARY
Course Elapsed Days: 23
Plan Fractions Treated to Date: 2
Plan Prescribed Dose Per Fraction: 2 Gy
Plan Total Fractions Prescribed: 5
Plan Total Prescribed Dose: 10 Gy
Reference Point Dosage Given to Date: 46.56 Gy
Reference Point Session Dosage Given: 2 Gy
Session Number: 18

## 2022-07-15 ENCOUNTER — Ambulatory Visit
Admission: RE | Admit: 2022-07-15 | Discharge: 2022-07-15 | Disposition: A | Discharge status: Home / Self Care | Payer: BC Managed Care – PPO | Source: Ambulatory Visit | Attending: Radiation Oncology | Admitting: Radiation Oncology

## 2022-07-15 ENCOUNTER — Other Ambulatory Visit: Payer: Self-pay

## 2022-07-15 DIAGNOSIS — C50412 Malignant neoplasm of upper-outer quadrant of left female breast: Secondary | ICD-10-CM | POA: Diagnosis not present

## 2022-07-15 LAB — RAD ONC ARIA SESSION SUMMARY
Course Elapsed Days: 24
Plan Fractions Treated to Date: 3
Plan Prescribed Dose Per Fraction: 2 Gy
Plan Total Fractions Prescribed: 5
Plan Total Prescribed Dose: 10 Gy
Reference Point Dosage Given to Date: 48.56 Gy
Reference Point Session Dosage Given: 2 Gy
Session Number: 19

## 2022-07-18 ENCOUNTER — Other Ambulatory Visit: Payer: Self-pay

## 2022-07-18 ENCOUNTER — Ambulatory Visit
Admission: RE | Admit: 2022-07-18 | Discharge: 2022-07-18 | Disposition: A | Discharge status: Home / Self Care | Payer: BC Managed Care – PPO | Source: Ambulatory Visit | Attending: Radiation Oncology | Admitting: Radiation Oncology

## 2022-07-18 DIAGNOSIS — C50412 Malignant neoplasm of upper-outer quadrant of left female breast: Secondary | ICD-10-CM | POA: Diagnosis not present

## 2022-07-18 LAB — RAD ONC ARIA SESSION SUMMARY
Course Elapsed Days: 27
Plan Fractions Treated to Date: 4
Plan Prescribed Dose Per Fraction: 2 Gy
Plan Total Fractions Prescribed: 5
Plan Total Prescribed Dose: 10 Gy
Reference Point Dosage Given to Date: 50.56 Gy
Reference Point Session Dosage Given: 2 Gy
Session Number: 20

## 2022-07-19 ENCOUNTER — Ambulatory Visit
Admission: RE | Admit: 2022-07-19 | Discharge: 2022-07-19 | Disposition: A | Discharge status: Home / Self Care | Payer: BC Managed Care – PPO | Source: Ambulatory Visit | Attending: Radiation Oncology | Admitting: Radiation Oncology

## 2022-07-19 ENCOUNTER — Other Ambulatory Visit: Payer: Self-pay

## 2022-07-19 DIAGNOSIS — C50412 Malignant neoplasm of upper-outer quadrant of left female breast: Secondary | ICD-10-CM | POA: Diagnosis not present

## 2022-07-19 LAB — RAD ONC ARIA SESSION SUMMARY
Course Elapsed Days: 28
Plan Fractions Treated to Date: 5
Plan Prescribed Dose Per Fraction: 2 Gy
Plan Total Fractions Prescribed: 5
Plan Total Prescribed Dose: 10 Gy
Reference Point Dosage Given to Date: 52.56 Gy
Reference Point Session Dosage Given: 2 Gy
Session Number: 21

## 2022-07-26 NOTE — Progress Notes (Signed)
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  INTERVAL HISTORY: Patient returns to clinic today at the conclusion of her XRT for further evaluation and initiation of tamoxifen.  She tolerated her treatments well without significant side effects.  She continues to feel well and is asymptomatic.  She has no neurologic complaints.  She denies any recent fevers or illnesses.  She has a good appetite and denies weight loss.  She has no chest pain, shortness of breath, cough, or hemoptysis.  She denies any nausea, vomiting, constipation, or diarrhea.  She has no urinary complaints.  Patient offers no specific complaints today.  REVIEW OF SYSTEMS:   Review of Systems  Constitutional: Negative.  Negative for fever, malaise/fatigue and weight loss.  Respiratory: Negative.  Negative for cough, hemoptysis and shortness of breath.   Cardiovascular: Negative.  Negative for chest pain and leg swelling.  Gastrointestinal: Negative.  Negative for abdominal pain.  Genitourinary: Negative.  Negative for dysuria.  Musculoskeletal: Negative.  Negative for back pain.  Skin: Negative.  Negative for rash.  Neurological: Negative.  Negative for dizziness, focal weakness, weakness and headaches.  Psychiatric/Behavioral: Negative.  The patient is not nervous/anxious.     As per HPI. Otherwise, a complete review of systems is negative.  PAST MEDICAL HISTORY: Past Medical History:  Diagnosis Date   Breast cancer ([place]) 04/13/2022   left (estrogen receptor positive)   DVT (deep venous thrombosis) ([place]) 01/26/2021   left leg   Heart murmur    Iron  deficiency anemia    PE (pulmonary thromboembolism) ([place]) 06/11/2020   Pneumonia     PAST SURGICAL HISTORY: Past Surgical History:  Procedure Laterality Date   ANKLE FRACTURE SURGERY Right 2005   internal fixation   BREAST BIOPSY Left 04/13/2022   [place] Core bx coil clip-path pending   ORIF WRIST FRACTURE Left 10/30/2019   Procedure: OPEN REDUCTION INTERNAL FIXATION (ORIF) WRIST FRACTURE with percutaneous screw;  Surgeon: Corky Mull, MD;  Location: [facility] ORS;  Service: Orthopedics;  Laterality: Left;   PARTIAL MASTECTOMY WITH AXILLARY SENTINEL LYMPH NODE BIOPSY Left 04/29/2022   Procedure: PARTIAL MASTECTOMY WITH AXILLARY SENTINEL LYMPH NODE BIOPSY;  Surgeon: Benjamine Sprague, DO;  Location: [facility] ORS;  Service: General;  Laterality: Left;    FAMILY HISTORY: Family History  Problem Relation Age of Onset   Diabetes Father    Heart disease Father    Hyperlipidemia Brother    Prostate cancer Paternal Grandfather        d. 28s   Breast cancer Other    Breast cancer Cousin        dx 24s   Breast cancer Maternal Great-grandmother     ADVANCED DIRECTIVES (Y/N):  N  HEALTH MAINTENANCE: Social History   Tobacco Use   Smoking status: Every Day    Packs/day: 0.50    Types: Cigarettes   Smokeless tobacco: Never  Vaping Use   Vaping Use: Never used  Substance Use Topics   Alcohol use: Yes    Alcohol/week: 14.0 standard drinks of alcohol    Types: 14 Glasses of wine per week   Drug use: Not Currently    Types: Marijuana  Colonoscopy:  PAP:  Bone density:  Lipid panel:  Allergies  Allergen Reactions   Azithromycin Hives and Itching    Current Outpatient Medications  Medication Sig Dispense Refill   apixaban (ELIQUIS) 2.5 MG TABS tablet Take 2.5 mg by mouth 2 (two) times daily.     tamoxifen (NOLVADEX) 20 MG tablet Take 1 tablet (20 mg total) by mouth daily. 90 tablet 3   No current facility-administered medications for this visit.    OBJECTIVE: Vitals:   07/27/22  1017  BP: 118/76  Pulse: (!) 58  Resp: 18  Temp: 98 F (36.7 C)  SpO2: 100%     Body mass index is 24.14 kg/m.    ECOG FS:0 - Asymptomatic  General: Well-developed, well-nourished, no acute distress. Eyes: Pink conjunctiva, anicteric sclera. HEENT: Normocephalic, moist mucous membranes. Breast: Exam deferred today. Lungs: No audible wheezing or coughing. Heart: Regular rate and rhythm. Abdomen: Soft, nontender, no obvious distention. Musculoskeletal: No edema, cyanosis, or clubbing. Neuro: Alert, answering all questions appropriately. Cranial nerves grossly intact. Skin: No rashes or petechiae noted. Psych: Normal affect.   LAB RESULTS:  Lab Results  Component Value Date   NA 137 06/11/2020   K 3.7 06/11/2020   CL 107 06/11/2020   CO2 23 06/11/2020   GLUCOSE 109 (H) 06/11/2020   BUN 10 06/11/2020   CREATININE 0.72 06/11/2020   CALCIUM 9.2 06/11/2020   GFRNONAA >60 06/11/2020   GFRAA >60 06/11/2020    Lab Results  Component Value Date   WBC 6.2 07/04/2022   HGB 11.1 (L) 07/04/2022   HCT 35.2 (L) 07/04/2022   MCV 91.2 07/04/2022   PLT 298 07/04/2022     STUDIES: CT CHEST WO CONTRAST  Result Date: 07/12/2022 CLINICAL DATA:  Pulmonary nodules. Left breast cancer. * Tracking Code: BO * EXAM: CT CHEST WITHOUT CONTRAST TECHNIQUE: Multidetector CT imaging of the chest was performed following the standard protocol without IV contrast. RADIATION DOSE REDUCTION: This exam was performed according to the departmental dose-optimization program which includes automated exposure control, adjustment of the mA and/or kV according to patient size and/or use of iterative reconstruction technique. COMPARISON:  07/09/2021 and 12/18/2020. FINDINGS: Cardiovascular: Right coronary artery calcification. Heart size normal. No pericardial effusion. Mediastinum/Nodes: Subcentimeter low-attenuation lesions in the thyroid. No follow-up recommended. (Ref: J Am Coll Radiol. 2015 Feb;12(2):  143-50).No pathologically enlarged mediastinal lymph nodes. Hilar regions are difficult to definitively evaluate without IV contrast. Suspect postoperative scarring in the left axilla. No pathologically enlarged axillary or internal mammary lymph nodes. Esophagus is grossly unremarkable. Lungs/Pleura: Paraseptal emphysema. Smoking related respiratory bronchiolitis. A few scattered subpleural nodules measure up to 7 mm in the right middle lobe, unchanged. No suspicious pulmonary nodules. Minimal dependent atelectasis bilaterally. No pleural fluid. Airway is unremarkable. Upper Abdomen: Visualized portions of the liver, gallbladder, adrenal glands and right kidney are unremarkable. Small stone in the left kidney. Visualized portions of the spleen, pancreas, stomach and bowel are grossly unremarkable. Musculoskeletal: No worrisome lytic or sclerotic lesions. IMPRESSION: 1. Scattered subpleural nodules measure up to 7 mm and are unchanged from 12/18/2020, indicative of benign subpleural lymph nodes. No follow-up necessary. 2. No evidence of metastatic disease. 3. Right coronary artery calcification, age advanced. 4. Left renal stone. 5.  Emphysema (ICD10-J43.9). Electronically Signed   By: Lorin Picket M.D.   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  There was not enough tissue to send for Oncotype, therefore this order has been canceled.  Given the small size of her malignancy, and the fact that it is a grade 1, no chemotherapy is necessary.  Patient has now completed adjuvant XRT.  She was given a prescription for tamoxifen today which she will take for 5 years completing treatment in September 2028.   Return to clinic in 3 months with video assisted telemedicine visit.   Iron deficiency anemia: Patient states this is longstanding secondary to heavy menses.  She cannot tolerate oral iron supplementation.  Her most recent hemoglobin is 11.1, monitor.   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  Her first blood clot was noted on CT of the chest on June 11, 2020 which she states coincided with COVID-vaccine.  Patient was off treatment for several weeks and by report had a DVT on January 26, 2021. Unclear what work-up has been done up to this point.  Patient will likely require full work-up in the future. Genetics: Given patient's age, she was also given a genetics referral.   Patient expressed understanding and was in agreement with this plan. She also understands that She can call clinic at any time with any questions, concerns, or complaints.    Cancer Staging  Carcinoma of upper-outer quadrant of left breast in female, estrogen receptor positive ([place]) Staging form: Breast, AJCC 8th Edition - Clinical stage from 04/17/2022: Stage IA (cT1b, cN0, cM0, G1, ER+, PR+, HER2-) - Signed by Lloyd Huger, MD on 04/17/2022 Stage prefix: Initial diagnosis Histologic grading system: 3 grade system  Lloyd Huger, MD   07/29/2022 1:17 PM

## 2022-07-27 ENCOUNTER — Encounter: Payer: Self-pay | Admitting: Oncology

## 2022-07-27 ENCOUNTER — Inpatient Hospital Stay (HOSPITAL_BASED_OUTPATIENT_CLINIC_OR_DEPARTMENT_OTHER): Payer: BC Managed Care – PPO | Admitting: Oncology

## 2022-07-27 ENCOUNTER — Encounter: Payer: Self-pay | Admitting: *Deleted

## 2022-07-27 VITALS — BP 118/76 | HR 58 | Temp 98.0°F | Resp 18 | Ht 74.0 in | Wt 188.0 lb

## 2022-07-27 DIAGNOSIS — Z79810 Long term (current) use of selective estrogen receptor modulators (SERMs): Secondary | ICD-10-CM | POA: Diagnosis not present

## 2022-07-27 DIAGNOSIS — C50412 Malignant neoplasm of upper-outer quadrant of left female breast: Secondary | ICD-10-CM | POA: Insufficient documentation

## 2022-07-27 DIAGNOSIS — Z720 Tobacco use: Secondary | ICD-10-CM | POA: Insufficient documentation

## 2022-07-27 DIAGNOSIS — D509 Iron deficiency anemia, unspecified: Secondary | ICD-10-CM | POA: Insufficient documentation

## 2022-07-27 DIAGNOSIS — F17210 Nicotine dependence, cigarettes, uncomplicated: Secondary | ICD-10-CM | POA: Insufficient documentation

## 2022-07-27 DIAGNOSIS — Z170 Estrogen receptor positive status [ER+]: Secondary | ICD-10-CM

## 2022-07-27 DIAGNOSIS — Z86711 Personal history of pulmonary embolism: Secondary | ICD-10-CM | POA: Insufficient documentation

## 2022-07-27 DIAGNOSIS — Z86718 Personal history of other venous thrombosis and embolism: Secondary | ICD-10-CM | POA: Insufficient documentation

## 2022-07-27 DIAGNOSIS — Z7901 Long term (current) use of anticoagulants: Secondary | ICD-10-CM | POA: Insufficient documentation

## 2022-07-27 DIAGNOSIS — N939 Abnormal uterine and vaginal bleeding, unspecified: Secondary | ICD-10-CM | POA: Insufficient documentation

## 2022-07-27 MED ORDER — TAMOXIFEN CITRATE 20 MG PO TABS: 20.0000 mg | ORAL_TABLET | Freq: Every day | ORAL | 3 refills | Status: DC

## 2022-08-31 ENCOUNTER — Ambulatory Visit: Payer: BC Managed Care – PPO | Admitting: Radiation Oncology

## 2022-09-01 ENCOUNTER — Encounter: Payer: Self-pay | Admitting: Radiation Oncology

## 2022-09-01 ENCOUNTER — Ambulatory Visit
Admission: RE | Admit: 2022-09-01 | Discharge: 2022-09-01 | Disposition: A | Discharge status: Home / Self Care | Payer: BC Managed Care – PPO | Source: Ambulatory Visit | Attending: Radiation Oncology | Admitting: Radiation Oncology

## 2022-09-01 VITALS — BP 129/82 | HR 78 | Temp 97.5°F | Resp 14 | Wt 187.0 lb

## 2022-09-01 DIAGNOSIS — Z923 Personal history of irradiation: Secondary | ICD-10-CM | POA: Diagnosis not present

## 2022-09-01 DIAGNOSIS — C50412 Malignant neoplasm of upper-outer quadrant of left female breast: Secondary | ICD-10-CM | POA: Diagnosis not present

## 2022-09-01 DIAGNOSIS — Z170 Estrogen receptor positive status [ER+]: Secondary | ICD-10-CM | POA: Diagnosis not present

## 2022-09-01 DIAGNOSIS — Z79810 Long term (current) use of selective estrogen receptor modulators (SERMs): Secondary | ICD-10-CM | POA: Diagnosis not present

## 2022-09-01 NOTE — Progress Notes (Signed)
Radiation Oncology Follow up Note  Name: Maria Young   Date:   09/01/2022 MRN:  [id number] DOB: August 21, 1976    This 46 y.o. female presents to the clinic today for 1 month follow-up status post whole breast radiation to her left breast for stage Ia invasive mammary carcinoma ER/PR positive.  REFERRING PROVIDER: Gladstone Lighter, MD  HPI: Patient is a 46 year old female now out 1 month including whole breast radiation to her left breast for stage Ia ER positive invasive mammary carcinoma.  Seen today in routine follow-up she is doing well.  She specifically denies breast tenderness cough or bone pain.  She has been started on.  Tamoxifen tolerating it well without side effect.  COMPLICATIONS OF TREATMENT: none  FOLLOW UP COMPLIANCE: keeps appointments   PHYSICAL EXAM:  BP 129/82 (BP Location: Left Arm, Patient Position: Sitting)   Pulse 78   Temp (!) 97.5 F (36.4 C) (Tympanic)   Resp 14   Wt 187 lb (84.8 kg)   BMI 24.01 kg/m  Lungs are clear to A&P cardiac examination essentially unremarkable with regular rate and rhythm. No dominant mass or nodularity is noted in either breast in 2 positions examined. Incision is well-healed. No axillary or supraclavicular adenopathy is appreciated. Cosmetic result is excellent.  Well-developed well-nourished patient in NAD. HEENT reveals PERLA, EOMI, discs not visualized.  Oral cavity is clear. No oral mucosal lesions are identified. Neck is clear without evidence of cervical or supraclavicular adenopathy. Lungs are clear to A&P. Cardiac examination is essentially unremarkable with regular rate and rhythm without murmur rub or thrill. Abdomen is benign with no organomegaly or masses noted. Motor sensory and DTR levels are equal and symmetric in the upper and lower extremities. Cranial nerves II through XII are grossly intact. Proprioception is intact. No peripheral adenopathy or edema is identified. No motor or sensory levels are noted. Crude visual  fields are within normal range.  RADIOLOGY RESULTS: No current films for review  PLAN: The present time patient is doing well 1 month out from whole breast radiation with excellent cosmetic result and low side effect profile.  Of asked to see her back in 6 months for follow-up.  Patient knows to call with any concerns.  I would like to take this opportunity to thank you for allowing me to participate in the care of your patient.Noreene Filbert, MD

## 2022-10-12 ENCOUNTER — Other Ambulatory Visit: Payer: Self-pay | Admitting: *Deleted

## 2022-10-12 MED ORDER — TAMOXIFEN CITRATE 20 MG PO TABS: 20.0000 mg | ORAL_TABLET | Freq: Every day | ORAL | 0 refills | Status: DC

## 2022-10-13 ENCOUNTER — Encounter: Payer: Self-pay | Admitting: Oncology

## 2022-10-27 ENCOUNTER — Other Ambulatory Visit: Payer: Self-pay | Admitting: Oncology

## 2022-10-27 ENCOUNTER — Inpatient Hospital Stay: Payer: BC Managed Care – PPO | Attending: Oncology | Admitting: Oncology

## 2022-10-27 ENCOUNTER — Telehealth: Payer: BC Managed Care – PPO | Admitting: Oncology

## 2022-10-27 DIAGNOSIS — D509 Iron deficiency anemia, unspecified: Secondary | ICD-10-CM

## 2022-10-27 DIAGNOSIS — Z7901 Long term (current) use of anticoagulants: Secondary | ICD-10-CM

## 2022-10-27 DIAGNOSIS — Z86718 Personal history of other venous thrombosis and embolism: Secondary | ICD-10-CM

## 2022-10-27 DIAGNOSIS — Z803 Family history of malignant neoplasm of breast: Secondary | ICD-10-CM

## 2022-10-27 DIAGNOSIS — F17210 Nicotine dependence, cigarettes, uncomplicated: Secondary | ICD-10-CM | POA: Diagnosis not present

## 2022-10-27 DIAGNOSIS — Z8042 Family history of malignant neoplasm of prostate: Secondary | ICD-10-CM

## 2022-10-27 DIAGNOSIS — C50412 Malignant neoplasm of upper-outer quadrant of left female breast: Secondary | ICD-10-CM | POA: Diagnosis not present

## 2022-10-27 DIAGNOSIS — Z170 Estrogen receptor positive status [ER+]: Secondary | ICD-10-CM

## 2022-10-27 DIAGNOSIS — Z86711 Personal history of pulmonary embolism: Secondary | ICD-10-CM

## 2022-10-27 NOTE — Progress Notes (Signed)
[place]  Telephone:(336) [phone] Fax:(336) [phone]  ID: Maria Young OB: [birth year]  MR#: [id number]  CSN#:[id number]  Patient Care Team: Gladstone Lighter, MD as PCP - General (Internal Medicine) Daiva Huge, RN as Oncology Nurse Navigator  I connected with Maria Young on 10/28/22 at  2:45 PM EST by video enabled telemedicine visit and verified that I am speaking with the correct person using two identifiers.   I discussed the limitations, risks, security and privacy concerns of performing an evaluation and management service by telemedicine and the availability of in-person appointments. I also discussed with the patient that there may be a patient responsible charge related to this service. The patient expressed understanding and agreed to proceed.   Other persons participating in the visit and their role in the encounter: Patient, MD.  Patient's location: Home. Provider's location: Clinic.  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  INTERVAL HISTORY: Patient returns to clinic today for routine 45-monthfollow-up and assess her toleration of tamoxifen.  She currently feels well and is asymptomatic.  She is tolerating treatment without significant side effects.  She states she no longer has her monthly period and may be entering menopause.  She has no neurologic complaints.  She denies any recent fevers or illnesses.  She has a good appetite and denies weight loss.  She has no chest pain, shortness of breath, cough, or hemoptysis.  She denies any nausea, vomiting, constipation, or diarrhea.  She has no urinary complaints.  Patient offers no specific complaints today.  REVIEW OF SYSTEMS:   Review of Systems  Constitutional: Negative.  Negative for fever, malaise/fatigue and weight loss.  Respiratory: Negative.  Negative for cough, hemoptysis and shortness of breath.    Cardiovascular: Negative.  Negative for chest pain and leg swelling.  Gastrointestinal: Negative.  Negative for abdominal pain.  Genitourinary: Negative.  Negative for dysuria.  Musculoskeletal: Negative.  Negative for back pain.  Skin: Negative.  Negative for rash.  Neurological: Negative.  Negative for dizziness, focal weakness, weakness and headaches.  Psychiatric/Behavioral: Negative.  The patient is not nervous/anxious.     As per HPI. Otherwise, a complete review of systems is negative.  PAST MEDICAL HISTORY: Past Medical History:  Diagnosis Date   Breast cancer ([place] 04/13/2022   left (estrogen receptor positive)   DVT (deep venous thrombosis) ([place] 01/26/2021   left leg   Heart murmur    Iron deficiency anemia    PE (p[place] throm[place]embolism) ([place] 06/11/2020   Pneumonia     PAST SURGICAL HISTORY: Past Surgical History:  Procedure Laterality Date   ANKLE FRACTURE SURGERY Right 2005   internal fixation   BREAST BIOPSY Left 04/13/2022   [place] b[place] coil clip-path pending   ORIF WRIST FRACTURE Left 10/30/2019   Procedure: OPEN REDUCTION INTERNAL FIXATION (ORIF) WRIST FRACTURE with percutaneous screw;  Surgeon: PCorky Mull MD;  Location: [facility] ORS;  Service: Orthopedics;  Laterality: Left;   PARTIAL MASTECTOMY WITH AXILLARY SENTINEL LYMPH NODE BIOPSY Left 04/29/2022   Procedure: PARTIAL MASTECTOMY WITH AXILLARY SENTINEL LYMPH NODE BIOPSY;  Surgeon: SBenjamine Sprague DO;  Location: [facility] ORS;  Service: General;  Laterality: Left;    FAMILY HISTORY: Family History  Problem Relation Age of Onset   Diabetes Father    Heart disease Father    Hyperlipidemia Brother    Prostate cancer Paternal Grandfather  d. 98s   Breast cancer Other    Breast cancer Cousin        dx 67s   Breast cancer Maternal Great-grandmother     ADVANCED DIRECTIVES (Y/N):  N  HEALTH MAINTENANCE: Social History   Tobacco Use   Smoking status: Every Day    Packs/day: 0.50    Types:  Cigarettes   Smokeless tobacco: Never  Vaping Use   Vaping Use: Never used  Substance Use Topics   Alcohol use: Yes    Alcohol/week: 14.0 standard drinks of alcohol    Types: 14 Glasses of wine per week   Drug use: Not Currently    Types: Marijuana     Colonoscopy:  PAP:  Bone density:  Lipid panel:  Allergies  Allergen Reactions   Azithromycin Hives and Itching    Current Outpatient Medications  Medication Sig Dispense Refill   apixaban (ELIQUIS) 2.5 MG TABS tablet Take 2.5 mg by mouth 2 (two) times daily.     tamoxifen (NOLVADEX) 20 MG tablet TAKE 1 TABLET BY MOUTH EVERY DAY 90 tablet 1   No current facility-administered medications for this visit.    OBJECTIVE: There were no vitals filed for this visit.    There is no height or weight on file to calculate BMI.    ECOG FS:0 - Asymptomatic  General: Well-developed, well-nourished, no acute distress. HEENT: Normocephalic. Neuro: Alert, answering all questions appropriately. Cranial nerves grossly intact. Psych: Normal affect.  LAB RESULTS:  Lab Results  Component Value Date   NA 137 06/11/2020   K 3.7 06/11/2020   CL 107 06/11/2020   CO2 23 06/11/2020   GLUCOSE 109 (H) 06/11/2020   BUN 10 06/11/2020   CREATININE 0.72 06/11/2020   CALCIUM 9.2 06/11/2020   GFRNONAA >60 06/11/2020   GFRAA >60 06/11/2020    Lab Results  Component Value Date   WBC 6.2 07/04/2022   HGB 11.1 (L) 07/04/2022   HCT 35.2 (L) 07/04/2022   MCV 91.2 07/04/2022   PLT 298 07/04/2022     STUDIES: No results found.  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  There was not enough tissue to send for Oncotype, therefore this order was not sent.   Given the small size of her malignancy, and the fact that it is a grade 1, no chemotherapy is necessary.  Patient has now completed adjuvant XRT.  Continue tamoxifen for a total of 5 years completing treatment in September 2028.  Return to clinic in 6 months with video assisted telemedicine visit.   Iron deficiency anemia: Patient states this is longstanding secondary to heavy menses.  She cannot tolerate oral iron supplementation.  Her most recent hemoglobin is 11.1, monitor.   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  Her first blood clot was noted on CT of the chest on June 11, 2020 which she states coincided with COVID-vaccine.  Patient was off treatment for several weeks and by report had a DVT on January 26, 2021. Unclear what work-up has been done up to this point.   Genetics: Given patient's age, she was previously given a genetics referral.  I provided 20 minutes of face-to-face video visit time during this  encounter which included chart review, counseling, and coordination of care as documented above.   Patient expressed understanding and was in agreement with this plan. She also understands that She can call clinic at any time with any questions, concerns, or complaints.    Cancer Staging  Carcinoma of upper-outer quadrant of left breast in female, estrogen receptor positive ([place]) Staging form: Breast, AJCC 8th Edition - Clinical stage from 04/17/2022: Stage IA (cT1b, cN0, cM0, G1, ER+, PR+, HER2-) - Signed by Lloyd Huger, MD on 04/17/2022 Stage prefix: Initial diagnosis Histologic grading system: 3 grade system  Lloyd Huger, MD   10/27/2022 3:40 PM

## 2023-01-20 ENCOUNTER — Encounter: Payer: Self-pay | Admitting: Oncology

## 2023-01-20 ENCOUNTER — Other Ambulatory Visit: Payer: Self-pay | Admitting: *Deleted

## 2023-01-20 MED ORDER — TAMOXIFEN CITRATE 20 MG PO TABS: 20.0000 mg | ORAL_TABLET | Freq: Every day | ORAL | 0 refills | Status: DC

## 2023-02-02 ENCOUNTER — Ambulatory Visit
Admission: RE | Admit: 2023-02-02 | Discharge: 2023-02-02 | Disposition: A | Discharge status: Home / Self Care | Payer: BC Managed Care – PPO | Source: Ambulatory Visit | Attending: Radiation Oncology | Admitting: Radiation Oncology

## 2023-02-02 ENCOUNTER — Encounter: Payer: Self-pay | Admitting: Radiation Oncology

## 2023-02-02 VITALS — BP 136/78 | HR 63 | Temp 98.0°F | Resp 16 | Ht 74.0 in | Wt 192.0 lb

## 2023-02-02 DIAGNOSIS — Z170 Estrogen receptor positive status [ER+]: Secondary | ICD-10-CM

## 2023-02-02 DIAGNOSIS — C50412 Malignant neoplasm of upper-outer quadrant of left female breast: Secondary | ICD-10-CM | POA: Diagnosis present

## 2023-02-02 DIAGNOSIS — Z79810 Long term (current) use of selective estrogen receptor modulators (SERMs): Secondary | ICD-10-CM | POA: Insufficient documentation

## 2023-02-02 DIAGNOSIS — Z923 Personal history of irradiation: Secondary | ICD-10-CM | POA: Insufficient documentation

## 2023-02-02 NOTE — Progress Notes (Signed)
Survivorship Care Plan visit completed.  Treatment summary reviewed and given to patient.  ASCO answers booklet reviewed and given to patient.  CARE program and Cancer Transitions discussed with patient along with other resources cancer center offers to patients and caregivers.  Patient verbalized understanding.    

## 2023-02-02 NOTE — Progress Notes (Signed)
Radiation Oncology Follow up Note  Name: Maria Young   Date:   02/02/2023 MRN:  [id number] DOB: 1976/04/07    This 47 y.o. female presents to the clinic today for 55-monthfollow-up status post whole breast radiation to her left breast for stage Ia invasive mammary carcinoma ER/PR positive.  REFERRING PROVIDER: KGladstone Lighter MD  HPI: Is a 47year old female now out 6 months having pleated whole breast radiation to her left breast for stage Ia ER positive invasive mammary carcinoma.  Seen today in routine follow-up she is doing well.  She specifically denies breast tenderness cough or bone pain..  She is currently on tamoxifen tolerating it well without side effect  COMPLICATIONS OF TREATMENT: none  FOLLOW UP COMPLIANCE: keeps appointments   PHYSICAL EXAM:  BP 136/78 (BP Location: Right Arm, Patient Position: Sitting, Cuff Size: Normal)   Pulse 63   Temp 98 F (36.7 C) (Tympanic)   Resp 16   Ht '6\' 2"'$  (1.88 m)   Wt 192 lb (87.1 kg)   BMI 24.65 kg/m  Lungs are clear to A&P cardiac examination essentially unremarkable with regular rate and rhythm. No dominant mass or nodularity is noted in either breast in 2 positions examined. Incision is well-healed. No axillary or supraclavicular adenopathy is appreciated. Cosmetic result is excellent.  Well-developed well-nourished patient in NAD. HEENT reveals PERLA, EOMI, discs not visualized.  Oral cavity is clear. No oral mucosal lesions are identified. Neck is clear without evidence of cervical or supraclavicular adenopathy. Lungs are clear to A&P. Cardiac examination is essentially unremarkable with regular rate and rhythm without murmur rub or thrill. Abdomen is benign with no organomegaly or masses noted. Motor sensory and DTR levels are equal and symmetric in the upper and lower extremities. Cranial nerves II through XII are grossly intact. Proprioception is intact. No peripheral adenopathy or edema is identified. No motor or sensory  levels are noted. Crude visual fields are within normal range. RADIOLOGY RESULTS: No current films for review  PLAN: Present time patient is doing well 6 months out from whole breast radiation and pleased with her overall progress.  She continues on tamoxifen without side effect.  Of asked to see her back in 6 months for follow-up.  Patient is to call with any concerns.  I would like to take this opportunity to thank you for allowing me to participate in the care of your patient..Noreene Filbert MD

## 2023-04-10 ENCOUNTER — Other Ambulatory Visit: Payer: Self-pay | Admitting: Oncology

## 2023-05-02 ENCOUNTER — Inpatient Hospital Stay: Payer: BC Managed Care – PPO | Attending: Oncology | Admitting: Oncology

## 2023-05-02 DIAGNOSIS — F17210 Nicotine dependence, cigarettes, uncomplicated: Secondary | ICD-10-CM

## 2023-05-02 DIAGNOSIS — Z7901 Long term (current) use of anticoagulants: Secondary | ICD-10-CM

## 2023-05-02 DIAGNOSIS — Z170 Estrogen receptor positive status [ER+]: Secondary | ICD-10-CM | POA: Diagnosis not present

## 2023-05-02 DIAGNOSIS — Z803 Family history of malignant neoplasm of breast: Secondary | ICD-10-CM

## 2023-05-02 DIAGNOSIS — Z8042 Family history of malignant neoplasm of prostate: Secondary | ICD-10-CM

## 2023-05-02 DIAGNOSIS — C50412 Malignant neoplasm of upper-outer quadrant of left female breast: Secondary | ICD-10-CM

## 2023-05-02 DIAGNOSIS — Z86718 Personal history of other venous thrombosis and embolism: Secondary | ICD-10-CM

## 2023-05-02 DIAGNOSIS — Z86711 Personal history of pulmonary embolism: Secondary | ICD-10-CM | POA: Diagnosis not present

## 2023-05-02 NOTE — Progress Notes (Signed)
[facility]  [phone] [phone]  ID: Maria Young OB: June 10, 1976  MR#: [id number]  AOZ#:[id number]  Patient Care Team: Enid Baas, MD as PCP - General (Internal Medicine) Hulen Luster, RN as Oncology Nurse Navigator Jeralyn Ruths, MD as Consulting Physician (Oncology) Carmina Miller, MD as Consulting Physician (Radiation Oncology) Sung Amabile, DO as Consulting Physician (Surgery)  I connected with Maria Young on 05/02/23 at  3:30 PM EDT by video enabled telemedicine visit and verified that I am speaking with the correct person using two identifiers.   I discussed the limitations, risks, security and privacy concerns of performing an evaluation and management service by telemedicine and the availability of in-person appointments. I also discussed with the patient that there may be a patient responsible charge related to this service. The patient expressed understanding and agreed to proceed.   Other persons participating in the visit and their role in the encounter: Patient, MD.  Patient's location: Home. Provider's location: Clinic.  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  INTERVAL HISTORY: Patient agreed to video assisted telemedicine visit for routine 64-month evaluation.  Due to technical difficulties, camera was not available.  She currently feels well and is asymptomatic.  She is tolerating tamoxifen without significant side effects.  Also, since initiating treatment her menses have significantly reduced and she is no longer anemic.  She has no neurologic complaints.  She denies any recent fevers or illnesses.  She has a good appetite and denies weight loss.  She has no chest pain, shortness of breath, cough, or hemoptysis.  She denies any nausea, vomiting, constipation, or diarrhea.  She has no urinary complaints.  Patient  offers no specific complaints today.  REVIEW OF SYSTEMS:   Review of Systems  Constitutional: Negative.  Negative for fever, malaise/fatigue and weight loss.  Respiratory: Negative.  Negative for cough, hemoptysis and shortness of breath.   Cardiovascular: Negative.  Negative for chest pain and leg swelling.  Gastrointestinal: Negative.  Negative for abdominal pain.  Genitourinary: Negative.  Negative for dysuria.  Musculoskeletal: Negative.  Negative for back pain.  Skin: Negative.  Negative for rash.  Neurological: Negative.  Negative for dizziness, focal weakness, weakness and headaches.  Psychiatric/Behavioral: Negative.  The patient is not nervous/anxious.     As per HPI. Otherwise, a complete review of systems is negative.  PAST MEDICAL HISTORY: Past Medical History:  Diagnosis Date   Breast cancer (HCC) 04/13/2022   left (estrogen receptor positive)   DVT (deep venous thrombosis) (HCC) 01/26/2021   left leg   Heart murmur    Iron deficiency anemia    PE (pulmonary thromboembolism) (HCC) 06/11/2020   Pneumonia     PAST SURGICAL HISTORY: Past Surgical History:  Procedure Laterality Date   ANKLE FRACTURE SURGERY Right 2005   internal fixation   BREAST BIOPSY Left 04/13/2022   [place] Core bx coil clip-path pending   ORIF WRIST FRACTURE Left 10/30/2019   Procedure: OPEN REDUCTION INTERNAL FIXATION (ORIF) WRIST FRACTURE with percutaneous screw;  Surgeon: Christena Flake, MD;  Location: [facility] ORS;  Service: Orthopedics;  Laterality: Left;   PARTIAL MASTECTOMY WITH AXILLARY SENTINEL LYMPH NODE BIOPSY Left 04/29/2022   Procedure: PARTIAL MASTECTOMY WITH AXILLARY SENTINEL LYMPH NODE BIOPSY;  Surgeon: Sung Amabile, DO;  Location: [facility] ORS;  Service: General;  Laterality: Left;    FAMILY HISTORY: Family History  Problem Relation Age of Onset  Diabetes Father    Heart disease Father    Hyperlipidemia Brother    Prostate cancer Paternal Grandfather        d. 93s   Breast cancer  Other    Breast cancer Cousin        dx 86s   Breast cancer Maternal Great-grandmother     ADVANCED DIRECTIVES (Y/N):  N  HEALTH MAINTENANCE: Social History   Tobacco Use   Smoking status: Every Day    Packs/day: .5    Types: Cigarettes   Smokeless tobacco: Never  Vaping Use   Vaping Use: Never used  Substance Use Topics   Alcohol use: Yes    Alcohol/week: 14.0 standard drinks of alcohol    Types: 14 Glasses of wine per week   Drug use: Not Currently    Types: Marijuana     Colonoscopy:  PAP:  Bone density:  Lipid panel:  Allergies  Allergen Reactions   Azithromycin Hives and Itching    Current Outpatient Medications  Medication Sig Dispense Refill   apixaban (ELIQUIS) 2.5 MG TABS tablet Take 2.5 mg by mouth 2 (two) times daily.     tamoxifen (NOLVADEX) 20 MG tablet TAKE 1 TABLET BY MOUTH EVERY DAY 90 tablet 0   Vitamin D, Ergocalciferol, (DRISDOL) 1.25 MG (50000 UNIT) CAPS capsule Take 50,000 Units by mouth once a week.     No current facility-administered medications for this visit.    OBJECTIVE: There were no vitals filed for this visit.    There is no height or weight on file to calculate BMI.    ECOG FS:0 - Asymptomatic  LAB RESULTS:  Lab Results  Component Value Date   NA 137 06/11/2020   K 3.7 06/11/2020   CL 107 06/11/2020   CO2 23 06/11/2020   GLUCOSE 109 (H) 06/11/2020   BUN 10 06/11/2020   CREATININE 0.72 06/11/2020   CALCIUM 9.2 06/11/2020   GFRNONAA >60 06/11/2020   GFRAA >60 06/11/2020    Lab Results  Component Value Date   WBC 6.2 07/04/2022   HGB 11.1 (L) 07/04/2022   HCT 35.2 (L) 07/04/2022   MCV 91.2 07/04/2022   PLT 298 07/04/2022     STUDIES: No results found.  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  There was not enough tissue to send for Oncotype, therefore this order was not sent.  Given the small size of her malignancy, and the fact that it is a grade 1, no chemotherapy is necessary.  Patient has now completed adjuvant XRT.  Continue tamoxifen for a total of 5 years completing treatment in September 2028.  Patient will require mammogram in the next 1 to 2 weeks.  Return to clinic in 6 months with video-assisted telemedicine visit.   Iron deficiency anemia: Resolved. 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  Her first blood clot was noted on CT of the chest on June 11, 2020 which she states coincided with COVID-vaccine.  Patient was off treatment for several weeks and by report had a DVT on January 26, 2021. Unclear what work-up has been done up to this point.   Genetics: Given patient's age, she was previously given a genetics  referral.  I provided 20 minutes of face-to-face video visit time during this encounter which included chart review, counseling, and coordination of care as documented above.    Patient expressed understanding and was in agreement with this plan. She also understands that She can call clinic at any time with any questions, concerns, or complaints.    Cancer Staging  Carcinoma of upper-outer quadrant of left breast in female, estrogen receptor positive (HCC) Staging form: Breast, AJCC 8th Edition - Clinical stage from 04/17/2022: Stage IA (cT1b, cN0, cM0, G1, ER+, PR+, HER2-) - Signed by Jeralyn Ruths, MD on 04/17/2022 Stage prefix: Initial diagnosis Histologic grading system: 3 grade system  Jeralyn Ruths, MD   05/02/2023 4:39 PM

## 2023-05-17 ENCOUNTER — Ambulatory Visit
Admission: RE | Admit: 2023-05-17 | Discharge: 2023-05-17 | Disposition: A | Discharge status: Home / Self Care | Payer: BC Managed Care – PPO | Source: Ambulatory Visit | Attending: Oncology | Admitting: Oncology

## 2023-05-17 DIAGNOSIS — Z170 Estrogen receptor positive status [ER+]: Secondary | ICD-10-CM | POA: Insufficient documentation

## 2023-05-17 DIAGNOSIS — C50412 Malignant neoplasm of upper-outer quadrant of left female breast: Secondary | ICD-10-CM | POA: Diagnosis present

## 2023-05-17 HISTORY — DX: Personal history of irradiation: Z92.3

## 2023-07-05 ENCOUNTER — Other Ambulatory Visit: Payer: Self-pay | Admitting: Oncology

## 2023-07-10 IMAGING — CT CT CHEST W/O CM
2 of 4 series · 15 of 36 positions shown, 18 images · non-contrast
Comparison: 12/18/2020 and 06/11/2020

CLINICAL DATA: Follow-up pulmonary nodules. Asymptomatic. Current
smoker.

EXAM:
CT CHEST WITHOUT CONTRAST
TECHNIQUE: Multidetector CT imaging of the chest was performed following the
standard protocol without IV contrast.

[Series 2: chest 2.00 · axial · 0.71mm/px · z∈[-1207,-895]mm · 12 of 186 slices shown, 15 images]
[im 15/186  mediastinal]
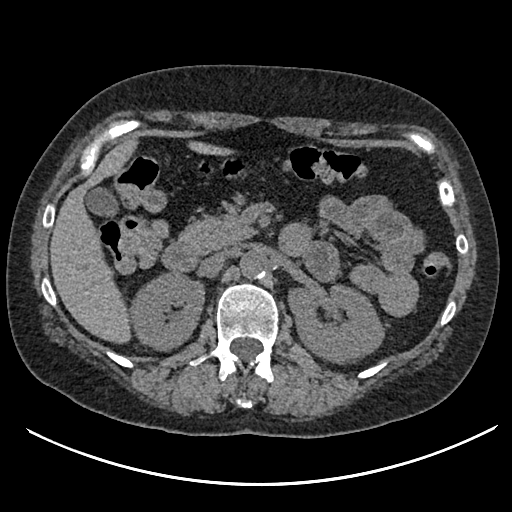
[im 15/186  lung]
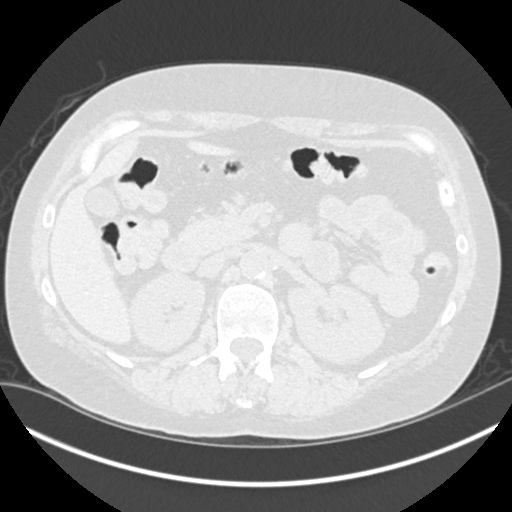
[im 29/186  lung]
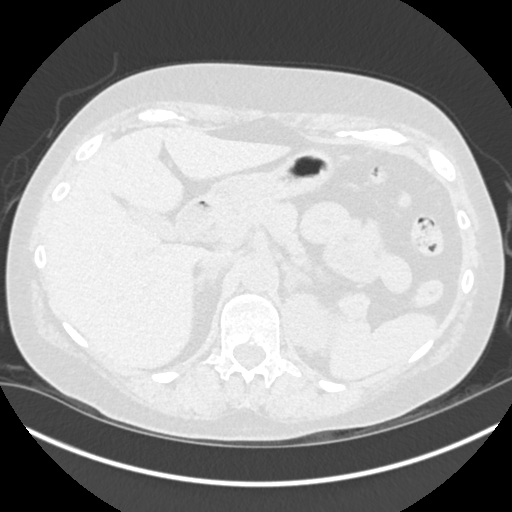
[im 43/186  lung]
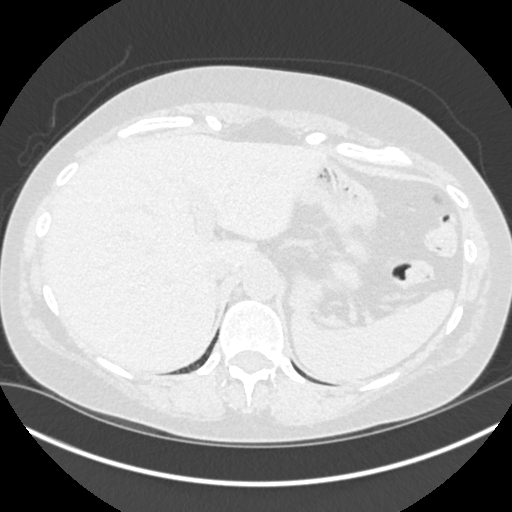
[im 57/186  lung]
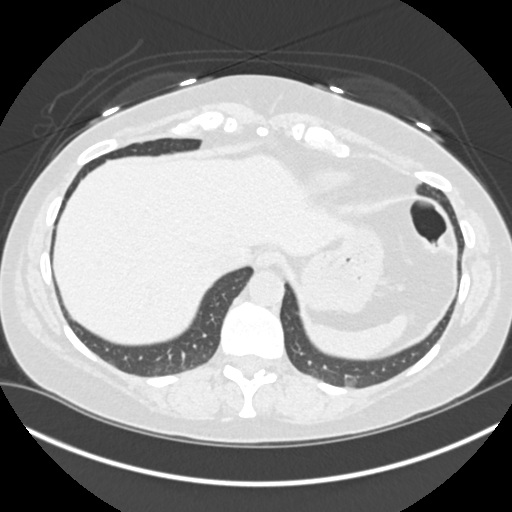
[im 72/186  mediastinal]
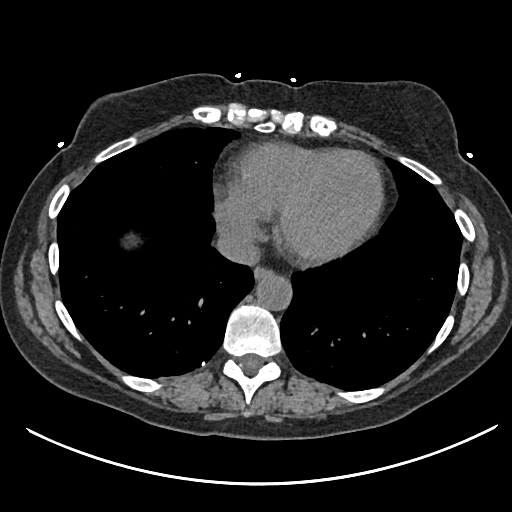
[im 72/186  lung]
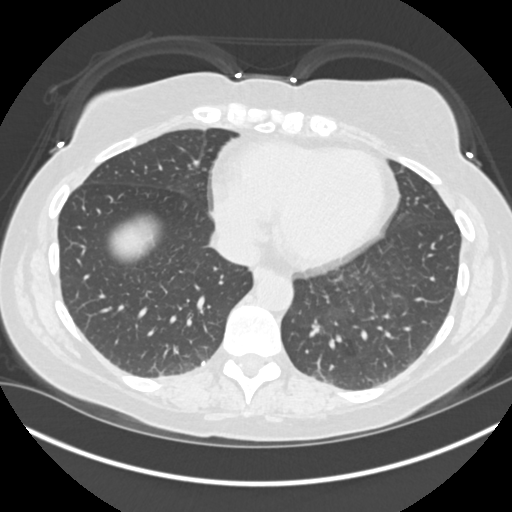
[im 86/186  lung]
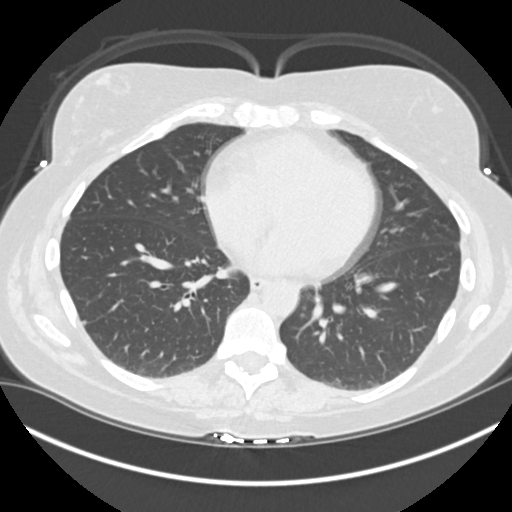
[im 100/186  lung]
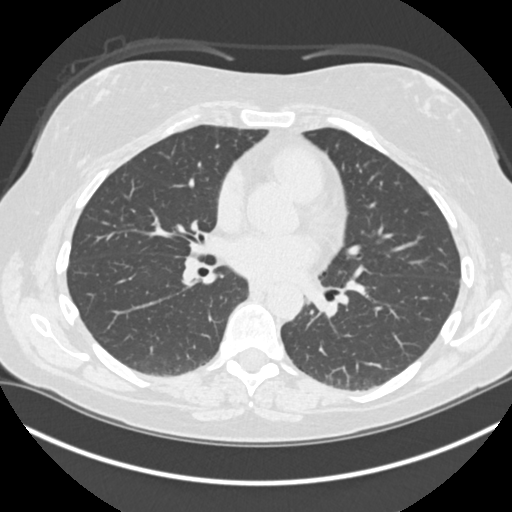
[im 114/186  lung]
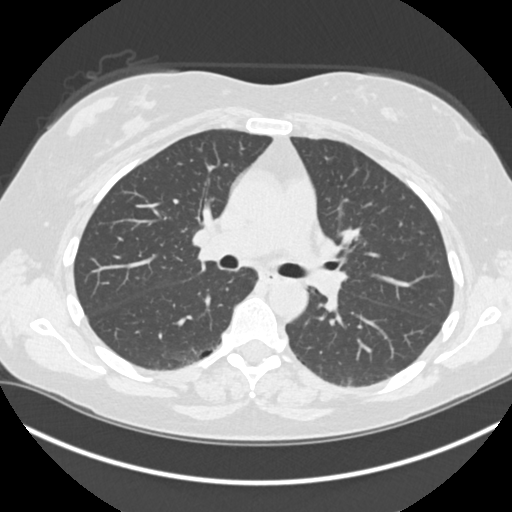
[im 129/186  mediastinal]
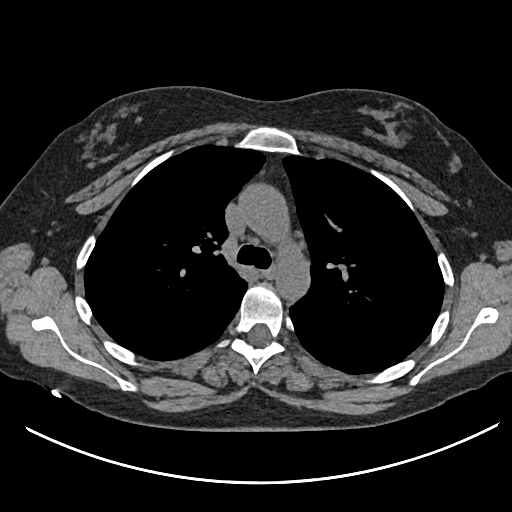
[im 129/186  lung]
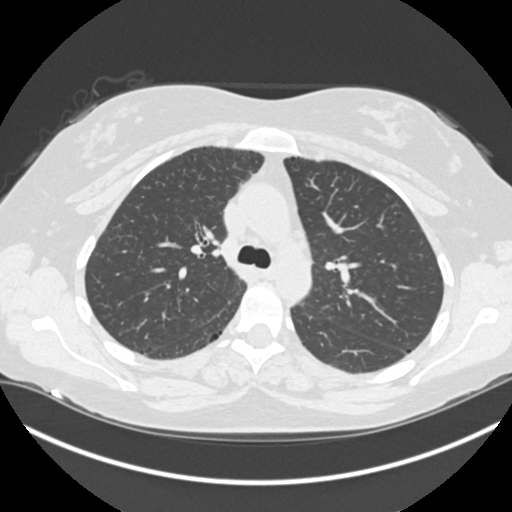
[im 143/186  lung]
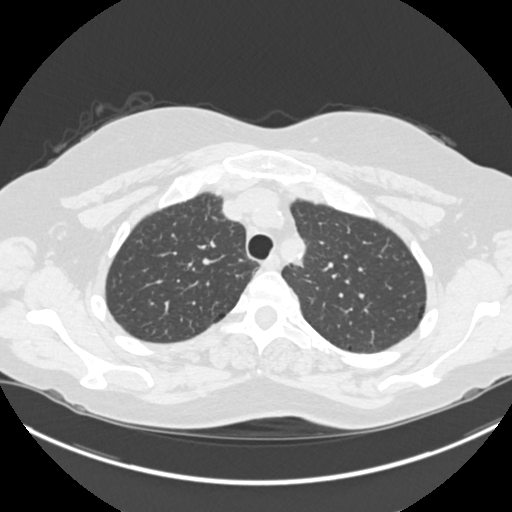
[im 157/186  lung]
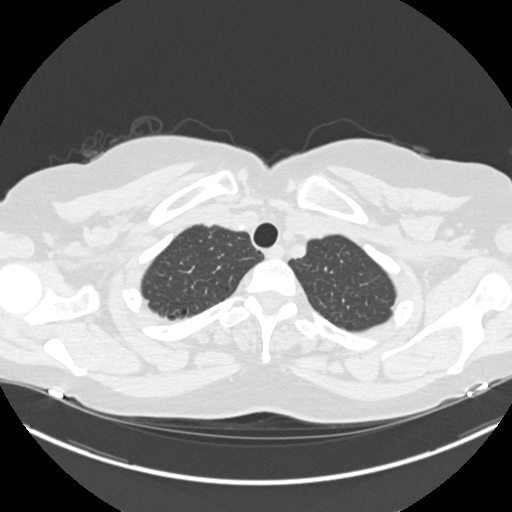
[im 171/186  lung]
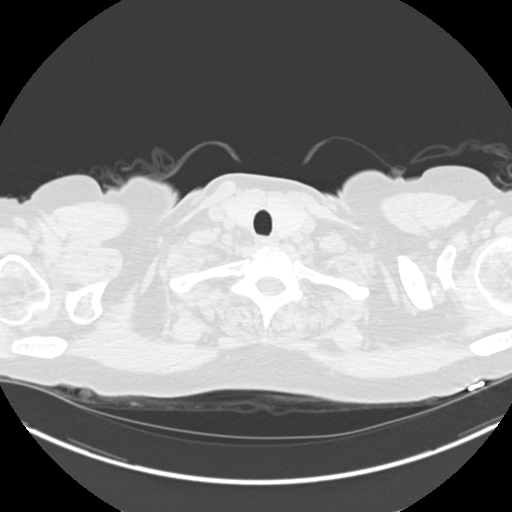

[Series 5: coronals chest 2.00 cor · coronal · 0.71mm/px · 3 of 139 slices shown]
[im 28/139  lung]
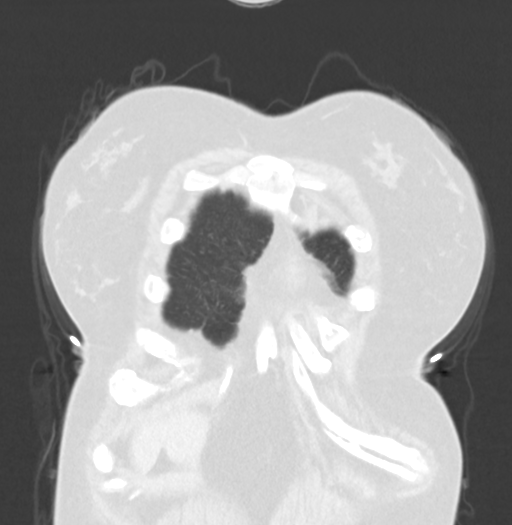
[im 56/139  lung]
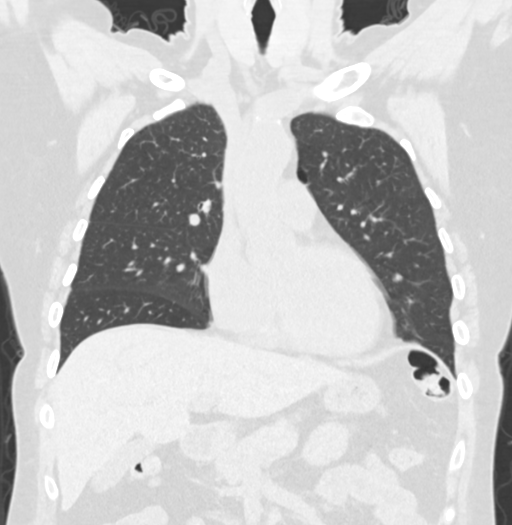
[im 83/139  lung]
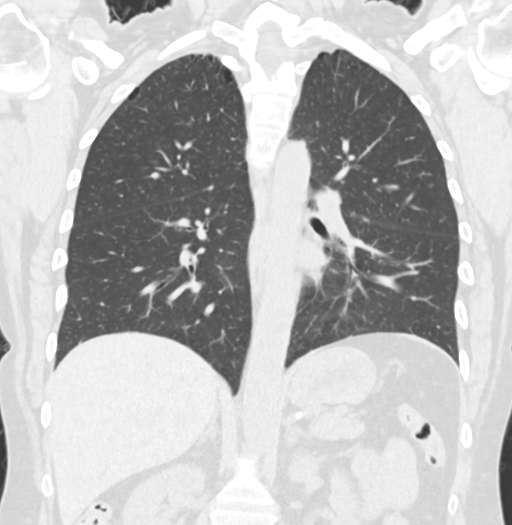

[15 of 36 positions shown; findings below may reference images not displayed]

FINDINGS: Cardiovascular: Heart is normal size. Thoracic aorta is normal in
caliber. Minimal calcified plaque over the descending thoracic
aorta. Remaining vascular structures are unremarkable.

Mediastinum/Nodes: No mediastinal or hilar adenopathy. Remaining
mediastinal structures are unremarkable.

Lungs/Pleura: Lungs are adequately inflated with paraseptal
emphysematous change over the upper lung/apices. Stable 5-6 mm
subpleural nodule over the right middle lobe. Stable subpleural 5 mm
nodule over the posteromedial right lower lobe. No evidence of focal
airspace consolidation or effusion. Airways are normal.

Upper Abdomen: Mild calcified plaque over the abdominal aorta.
Stable punctate calcification over the left kidney. No acute
findings.

Musculoskeletal: No focal abnormality.
IMPRESSION: 1. No acute cardiopulmonary disease.
2. Two stable subcentimeter right lung nodules with the largest
measuring 5-6 mm over the right middle lobe. Recommend follow-up
noncontrast chest CT 18 months to document 2 years stability.
This recommendation follows the consensus statement: Guidelines for
Management of Small Pulmonary Nodules Detected on CT Scans: A
Statement from the [HOSPITAL] as published in Radiology
7551; [DATE]. Online at:
[URL]
3. Stable punctate nonobstructing left renal stone.
4. Mild emphysematous disease.  Mild aortic atherosclerosis.

Aortic Atherosclerosis (THGKR-6GC.C) and Emphysema (THGKR-3XF.O).

## 2023-08-10 ENCOUNTER — Encounter: Payer: Self-pay | Admitting: Radiation Oncology

## 2023-08-10 ENCOUNTER — Ambulatory Visit
Admission: RE | Admit: 2023-08-10 | Discharge: 2023-08-10 | Disposition: A | Discharge status: Home / Self Care | Payer: BC Managed Care – PPO | Source: Ambulatory Visit | Attending: Radiation Oncology | Admitting: Radiation Oncology

## 2023-08-10 VITALS — BP 135/92 | HR 68 | Temp 98.0°F | Resp 14 | Ht 74.0 in | Wt 199.0 lb

## 2023-08-10 DIAGNOSIS — Z170 Estrogen receptor positive status [ER+]: Secondary | ICD-10-CM

## 2023-08-10 NOTE — Progress Notes (Signed)
Radiation Oncology Follow up Note  Name: Maria Young   Date:   08/10/2023 MRN:  [id number] DOB: December 06, 1975    This 47 y.o. female presents to the clinic today for 1 year follow-up status post whole breast radiation to her left breast for stage Ia invasive mammary carcinoma ER/PR positive.  REFERRING PROVIDER: Enid Baas, MD  HPI: Patient is a 47 year old female now out 1 year of completed whole breast radiation to her left breast for stage Ia invasive mammary carcinoma ER/PR positive.  Seen today in routine follow-up she is doing well.  She specifically denies breast tenderness cough or bone pain..  She had mammograms back in June which I have reviewed were BI-RADS 2 benign.  She is currently on tamoxifen tolerating that well without side effect  COMPLICATIONS OF TREATMENT: none  FOLLOW UP COMPLIANCE: keeps appointments   PHYSICAL EXAM:  BP (!) 135/92   Pulse 68   Temp 98 F (36.7 C)   Resp 14   Ht 6\' 2"  (1.88 m)   Wt 199 lb (90.3 kg)   LMP 04/04/2022 (Exact Date) Comment: Negative urine pregnancy test 04/29/2022  BMI 25.55 kg/m  Lungs are clear to A&P cardiac examination essentially unremarkable with regular rate and rhythm. No dominant mass or nodularity is noted in either breast in 2 positions examined. Incision is well-healed. No axillary or supraclavicular adenopathy is appreciated. Cosmetic result is excellent.  Well-developed well-nourished patient in NAD. HEENT reveals PERLA, EOMI, discs not visualized.  Oral cavity is clear. No oral mucosal lesions are identified. Neck is clear without evidence of cervical or supraclavicular adenopathy. Lungs are clear to A&P. Cardiac examination is essentially unremarkable with regular rate and rhythm without murmur rub or thrill. Abdomen is benign with no organomegaly or masses noted. Motor sensory and DTR levels are equal and symmetric in the upper and lower extremities. Cranial nerves II through XII are grossly intact.  Proprioception is intact. No peripheral adenopathy or edema is identified. No motor or sensory levels are noted. Crude visual fields are within normal range.  RADIOLOGY RESULTS: Mammograms reviewed compatible with above-stated findings  PLAN: At the present time patient is doing well no evidence of disease 1 year out from whole breast radiation and pleased with her overall progress.  She has an excellent cosmetic result.  I am going to turn follow-up care over to medical oncology.  Be happy to reevaluate the patient anytime should that be indicated.  Patient is to call with any concerns.  I would like to take this opportunity to thank you for allowing me to participate in the care of your patient.Carmina Miller, MD

## 2023-08-14 IMAGING — US US EXTREM LOW VENOUS
1 series · 13 of 24 positions shown · non-contrast
Comparison: Left lower extremity venous Doppler ultrasound
(positive for occlusive thrombus within the left peroneal vein

CLINICAL DATA: History of DVT and pulmonary embolism, currently on
anticoagulation. Evaluate for DVT.



[Series 1: us extrem low venous · 0.07mm/px · 13 of 58 slices shown]
[im 1/58]
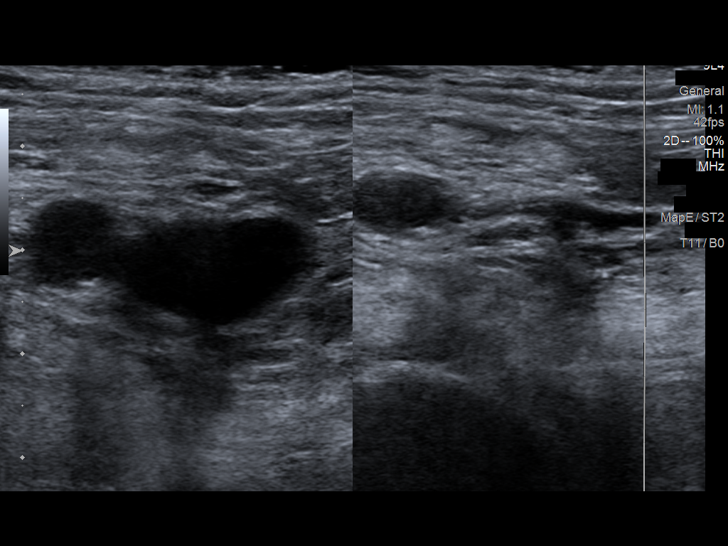
[im 5/58]
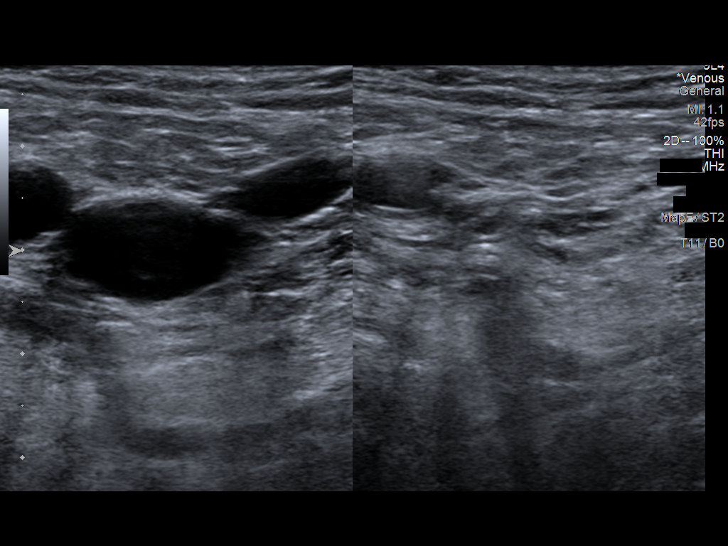
[im 10/58]
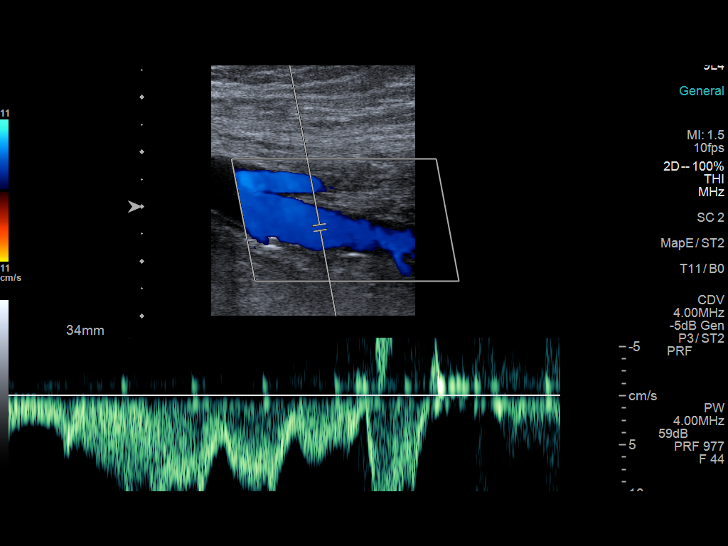
[im 15/58]
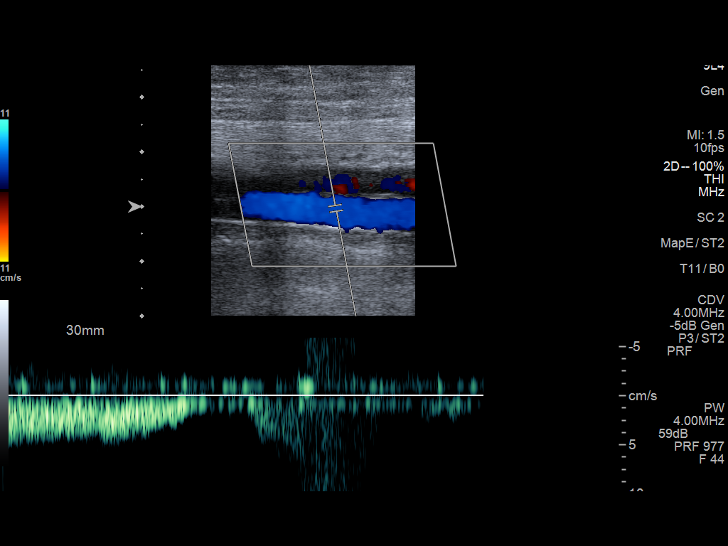
[im 20/58]
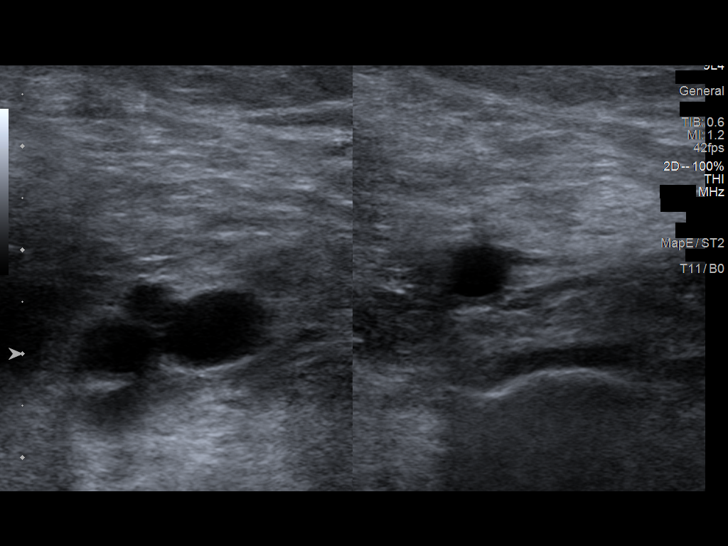
[im 25/58]
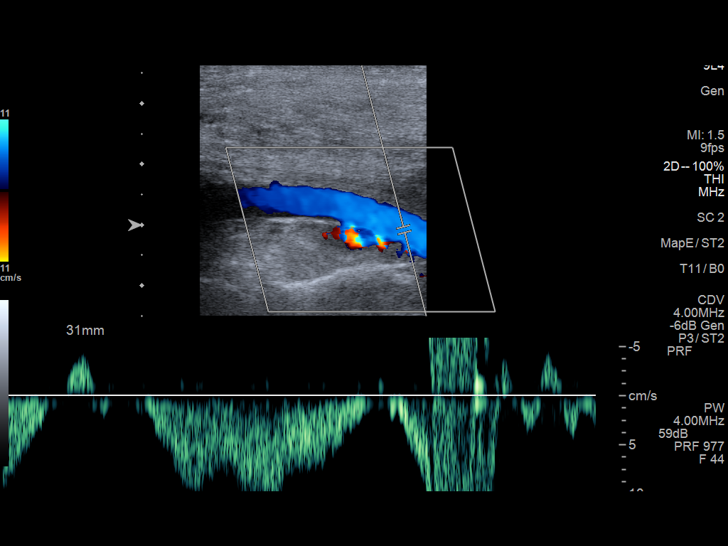
[im 30/58]
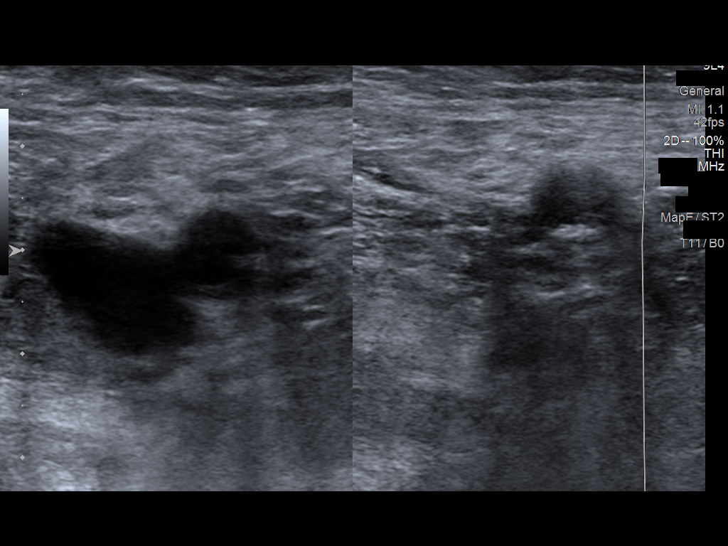
[im 33/58]
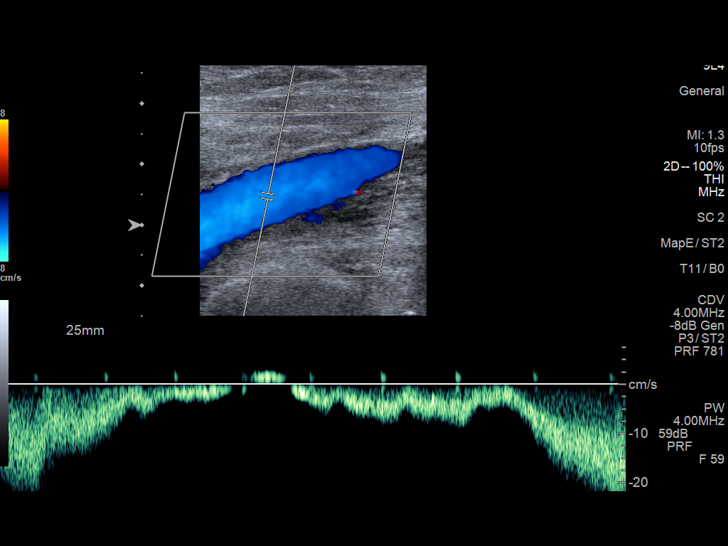
[im 38/58]
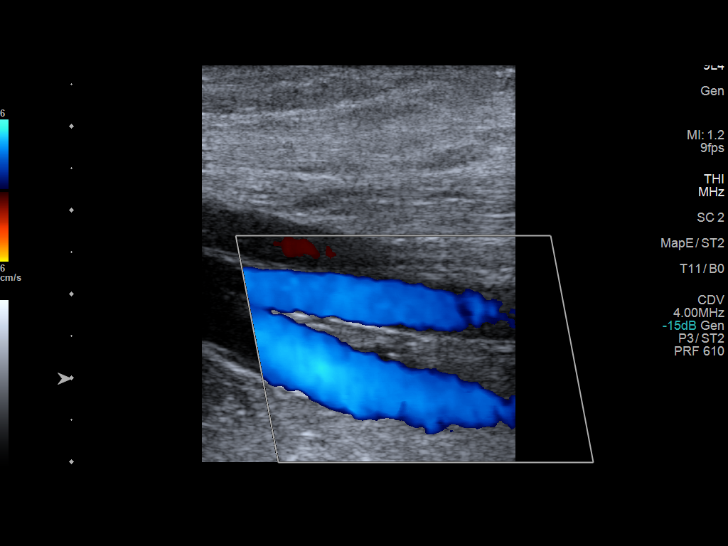
[im 43/58]
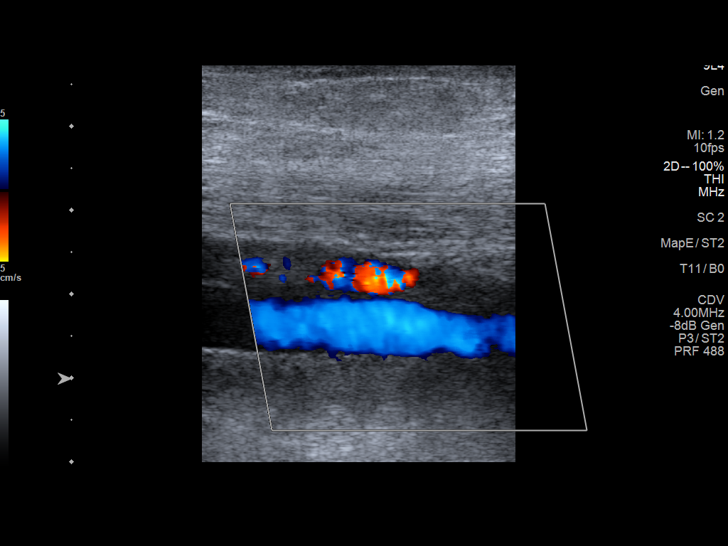
[im 48/58]
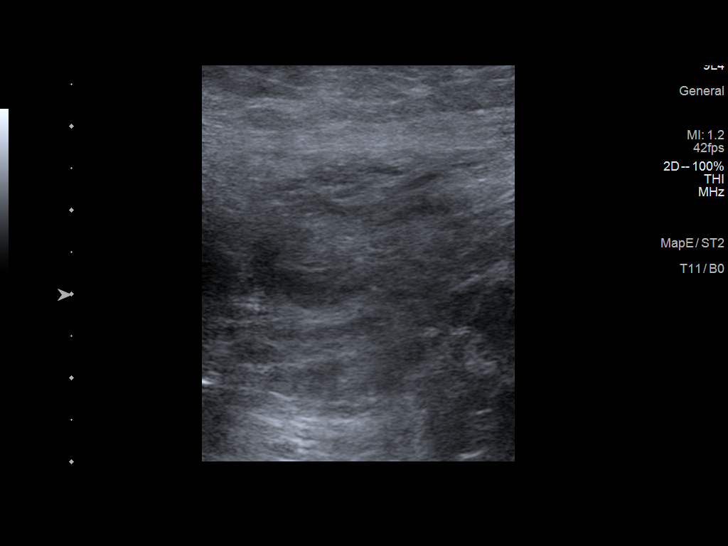
[im 53/58]
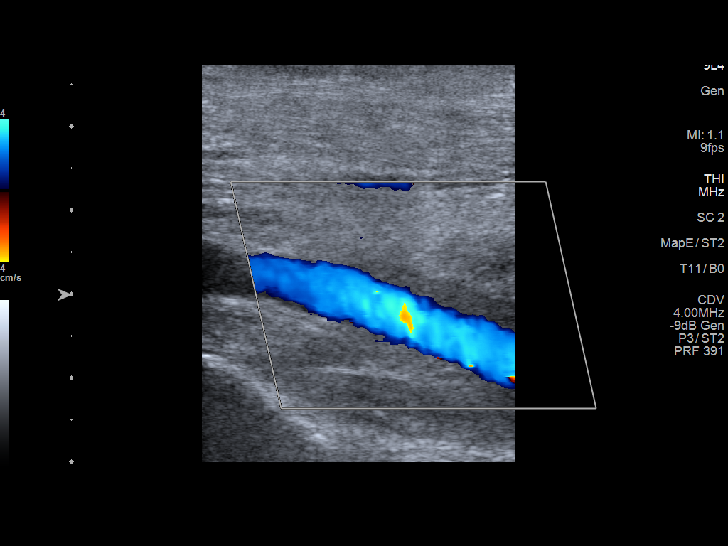
[im 58/58]
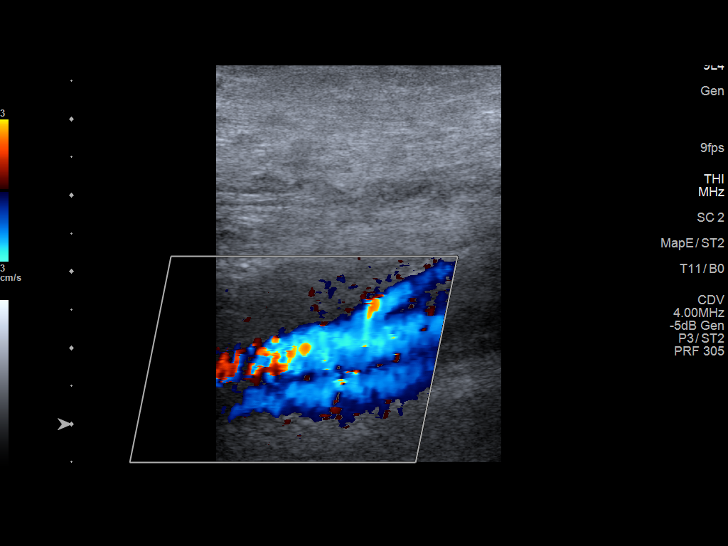

[13 of 24 positions shown; findings below may reference images not displayed]

FINDINGS: RIGHT LOWER EXTREMITY

Common Femoral Vein: No evidence of thrombus. Normal
compressibility, respiratory phasicity and response to augmentation.

Saphenofemoral Junction: No evidence of thrombus. Normal
compressibility and flow on color Doppler imaging.

Profunda Femoral Vein: No evidence of thrombus. Normal
compressibility and flow on color Doppler imaging.

Femoral Vein: No evidence of thrombus. Normal compressibility,
respiratory phasicity and response to augmentation.

Popliteal Vein: No evidence of thrombus. Normal compressibility,
respiratory phasicity and response to augmentation.

Calf Veins: No evidence of thrombus. Normal compressibility and flow
on color Doppler imaging.

Superficial Great Saphenous Vein: No evidence of thrombus. Normal
compressibility.

Venous Reflux:  None.

Other Findings:  None.

LEFT LOWER EXTREMITY

Common Femoral Vein: No evidence of thrombus. Normal
compressibility, respiratory phasicity and response to augmentation.

Saphenofemoral Junction: No evidence of thrombus. Normal
compressibility and flow on color Doppler imaging.

Profunda Femoral Vein: No evidence of thrombus. Normal
compressibility and flow on color Doppler imaging.

Femoral Vein: No evidence of thrombus. Normal compressibility,
respiratory phasicity and response to augmentation.

Popliteal Vein: No evidence of thrombus. Normal compressibility,
respiratory phasicity and response to augmentation.

Calf Veins: No evidence of acute or chronic thrombus with special
attention paid to the left peroneal vein. Normal compressibility and
flow on color Doppler imaging.

Superficial Great Saphenous Vein: No evidence of thrombus. Normal
compressibility.

Venous Reflux:  None.

Other Findings:  None.
IMPRESSION: No evidence of acute or chronic DVT within either lower extremity
with special attention paid to the left peroneal vein.

## 2023-10-18 ENCOUNTER — Other Ambulatory Visit: Payer: Self-pay | Admitting: Oncology

## 2023-10-18 MED ORDER — TAMOXIFEN CITRATE 20 MG PO TABS: 20.0000 mg | ORAL_TABLET | Freq: Every day | ORAL | 0 refills | Status: DC

## 2023-11-02 ENCOUNTER — Inpatient Hospital Stay: Payer: BC Managed Care – PPO | Attending: Oncology | Admitting: Oncology

## 2023-11-02 DIAGNOSIS — C50412 Malignant neoplasm of upper-outer quadrant of left female breast: Secondary | ICD-10-CM

## 2023-11-02 DIAGNOSIS — Z79810 Long term (current) use of selective estrogen receptor modulators (SERMs): Secondary | ICD-10-CM | POA: Diagnosis not present

## 2023-11-02 DIAGNOSIS — F17210 Nicotine dependence, cigarettes, uncomplicated: Secondary | ICD-10-CM | POA: Diagnosis not present

## 2023-11-02 DIAGNOSIS — Z170 Estrogen receptor positive status [ER+]: Secondary | ICD-10-CM

## 2023-11-02 DIAGNOSIS — Z803 Family history of malignant neoplasm of breast: Secondary | ICD-10-CM

## 2023-11-02 DIAGNOSIS — Z7901 Long term (current) use of anticoagulants: Secondary | ICD-10-CM

## 2023-11-02 DIAGNOSIS — Z8042 Family history of malignant neoplasm of prostate: Secondary | ICD-10-CM

## 2023-11-02 DIAGNOSIS — Z86718 Personal history of other venous thrombosis and embolism: Secondary | ICD-10-CM

## 2023-11-02 DIAGNOSIS — Z86711 Personal history of pulmonary embolism: Secondary | ICD-10-CM

## 2023-11-02 NOTE — Progress Notes (Signed)
[facility]  Telephone:([phone]) [phone] Fax:(336) [phone]  ID: Maria Young OB: 11-Jun-1976  MR#: [id number]  QIH#:[id number]  Patient Care Team: Enid Baas, MD as PCP - General (Internal Medicine) Hulen Luster, RN as Oncology Nurse Navigator Jeralyn Ruths, MD as Consulting Physician (Oncology) Carmina Miller, MD as Consulting Physician (Radiation Oncology) Sung Amabile, DO as Consulting Physician (Surgery)  I connected with Maria Young on 11/02/23 at  3:30 PM EST by video enabled telemedicine visit and verified that I am speaking with the correct person using two identifiers.   I discussed the limitations, risks, security and privacy concerns of performing an evaluation and management service by telemedicine and the availability of in-person appointments. I also discussed with the patient that there may be a patient responsible charge related to this service. The patient expressed understanding and agreed to proceed.   Other persons participating in the visit and their role in the encounter: Patient, MD.  Patient's location: Home. Provider's location: Clinic.   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  INTERVAL HISTORY: Patient agreed to video assisted telemedicine visit for her routine 86-month evaluation.  She continues to feel well and remains asymptomatic.  She is tolerating tamoxifen without significant side effects. She has no neurologic complaints.  She denies any recent fevers or illnesses.  She has a good appetite and denies weight loss.  She has no chest pain, shortness of breath, cough, or hemoptysis.  She denies any nausea, vomiting, constipation, or diarrhea.  She has no urinary complaints.  Patient offers no specific complaints today.  REVIEW OF SYSTEMS:   Review of Systems  Constitutional: Negative.  Negative for fever, malaise/fatigue and  weight loss.  Respiratory: Negative.  Negative for cough, hemoptysis and shortness of breath.   Cardiovascular: Negative.  Negative for chest pain and leg swelling.  Gastrointestinal: Negative.  Negative for abdominal pain.  Genitourinary: Negative.  Negative for dysuria.  Musculoskeletal: Negative.  Negative for back pain.  Skin: Negative.  Negative for rash.  Neurological: Negative.  Negative for dizziness, focal weakness, weakness and headaches.  Psychiatric/Behavioral: Negative.  The patient is not nervous/anxious.     As per HPI. Otherwise, a complete review of systems is negative.  PAST MEDICAL HISTORY: Past Medical History:  Diagnosis Date   Breast cancer (HCC) 04/13/2022   left (estrogen receptor positive)   DVT (deep venous thrombosis) (HCC) 01/26/2021   left leg   Heart murmur    Iron deficiency anemia    PE (pulmonary thromboembolism) (HCC) 06/11/2020   Personal history of radiation therapy    Pneumonia     PAST SURGICAL HISTORY: Past Surgical History:  Procedure Laterality Date   ANKLE FRACTURE SURGERY Right 2005   internal fixation   BREAST BIOPSY Left 04/13/2022   [place] Core bx coil clip-positive   BREAST LUMPECTOMY Left 04/29/2022   ORIF WRIST FRACTURE Left 10/30/2019   Procedure: OPEN REDUCTION INTERNAL FIXATION (ORIF) WRIST FRACTURE with percutaneous screw;  Surgeon: Christena Flake, MD;  Location: [facility] ORS;  Service: Orthopedics;  Laterality: Left;   PARTIAL MASTECTOMY WITH AXILLARY SENTINEL LYMPH NODE BIOPSY Left 04/29/2022   Procedure: PARTIAL MASTECTOMY WITH AXILLARY SENTINEL LYMPH NODE BIOPSY;  Surgeon: Sung Amabile, DO;  Location: [facility] ORS;  Service: General;  Laterality: Left;    FAMILY HISTORY: Family History  Problem Relation Age of Onset   Diabetes Father    Heart disease Father  Hyperlipidemia Brother    Prostate cancer Paternal Grandfather        d. 46s   Breast cancer Other    Breast cancer Cousin        dx 37s   Breast cancer Maternal  Great-grandmother     ADVANCED DIRECTIVES (Y/N):  N  HEALTH MAINTENANCE: Social History   Tobacco Use   Smoking status: Every Day    Current packs/day: 0.50    Types: Cigarettes   Smokeless tobacco: Never  Vaping Use   Vaping status: Never Used  Substance Use Topics   Alcohol use: Yes    Alcohol/week: 14.0 standard drinks of alcohol    Types: 14 Glasses of wine per week   Drug use: Not Currently    Types: Marijuana     Colonoscopy:  PAP:  Bone density:  Lipid panel:  Allergies  Allergen Reactions   Azithromycin Hives and Itching    Current Outpatient Medications  Medication Sig Dispense Refill   apixaban (ELIQUIS) 2.5 MG TABS tablet Take 2.5 mg by mouth 2 (two) times daily.     tamoxifen (NOLVADEX) 20 MG tablet Take 1 tablet (20 mg total) by mouth daily. 90 tablet 0   Vitamin D, Ergocalciferol, (DRISDOL) 1.25 MG (50000 UNIT) CAPS capsule Take 50,000 Units by mouth once a week.     No current facility-administered medications for this visit.    OBJECTIVE: There were no vitals filed for this visit.    There is no height or weight on file to calculate BMI.    ECOG FS:0 - Asymptomatic  General: Well-developed, well-nourished, no acute distress. HEENT: Normocephalic. Neuro: Alert, answering all questions appropriately. Cranial nerves grossly intact. Psych: Normal affect.  LAB RESULTS:  Lab Results  Component Value Date   NA 137 06/11/2020   K 3.7 06/11/2020   CL 107 06/11/2020   CO2 23 06/11/2020   GLUCOSE 109 (H) 06/11/2020   BUN 10 06/11/2020   CREATININE 0.72 06/11/2020   CALCIUM 9.2 06/11/2020   GFRNONAA >60 06/11/2020   GFRAA >60 06/11/2020    Lab Results  Component Value Date   WBC 6.2 07/04/2022   HGB 11.1 (L) 07/04/2022   HCT 35.2 (L) 07/04/2022   MCV 91.2 07/04/2022   PLT 298 07/04/2022     STUDIES: No results found.  ASSESSMENT: Clinical stage Ia ER/PR positive, HER-2 negative invasive carcinoma  of the upper outer quadrant of the  left breast.  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  There was not enough tissue to send for Oncotype, therefore this order was not sent.  Given the small size of her malignancy, and the fact that it is a grade 1, no chemotherapy was necessary.  Patient completed adjuvant XRT in September 2023.  She was given a prescription for tamoxifen which is recommended to take for a total of 5 years completing in September 2028.  Her most recent mammogram on May 17, 2023 was reported as BI-RADS 2.  Repeat in June 2025.  Patient will have video-assisted telemedicine visit 2 to 3 days after her next mammogram.     Iron deficiency anemia: Resolved. 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  Her first blood clot was noted on CT of the chest on June 11, 2020 which she states coincided with COVID-vaccine.  Patient was  off treatment for several weeks and by report had a DVT on January 26, 2021. Unclear what work-up has been done up to this point.   Genetics: Given patient's age, she was previously given a genetics referral.  I provided 20 minutes of face-to-face video visit time during this encounter which included chart review, counseling, and coordination of care as documented above.    Patient expressed understanding and was in agreement with this plan. She also understands that She can call clinic at any time with any questions, concerns, or complaints.    Cancer Staging  Carcinoma of upper-outer quadrant of left breast in female, estrogen receptor positive (HCC) Staging form: Breast, AJCC 8th Edition - Clinical stage from 04/17/2022: Stage IA (cT1b, cN0, cM0, G1, ER+, PR+, HER2-) - Signed by Jeralyn Ruths, MD on 04/17/2022 Stage prefix: Initial diagnosis Histologic grading system: 3 grade system  Jeralyn Ruths, MD    11/02/2023 3:40 PM

## 2024-01-12 ENCOUNTER — Other Ambulatory Visit: Payer: Self-pay | Admitting: Oncology

## 2024-03-11 IMAGING — MG MM DIGITAL SCREENING BILAT W/ TOMO AND CAD
8 series · 8 of 24 positions shown · non-contrast
Comparison: None.

CLINICAL DATA: Screening.

EXAM:
DIGITAL SCREENING BILATERAL MAMMOGRAM WITH TOMOSYNTHESIS AND CAD
TECHNIQUE: Bilateral screening digital craniocaudal and mediolateral oblique
mammograms were obtained. Bilateral screening digital breast
tomosynthesis was performed. The images were evaluated with
computer-aided detection.

[L CC synth-2D]
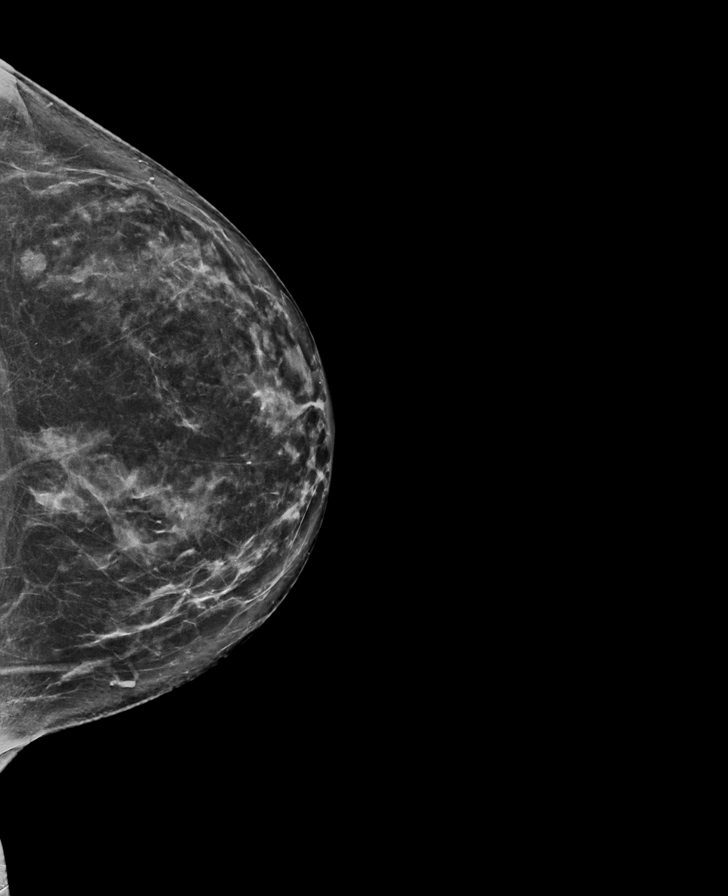

[R CC synth-2D]
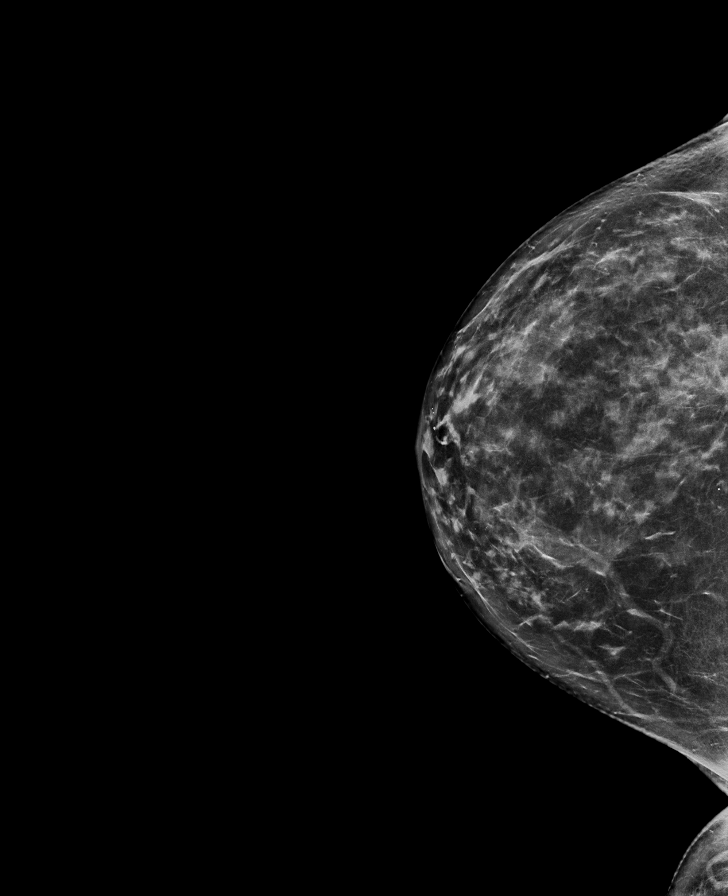

[R MLO synth-2D]
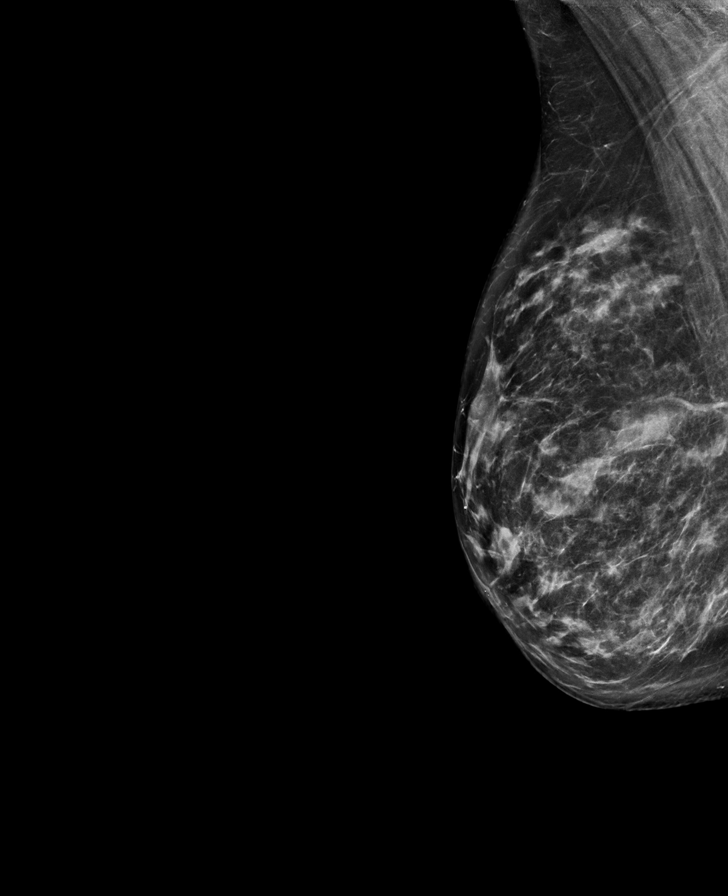

[L MLO synth-2D]
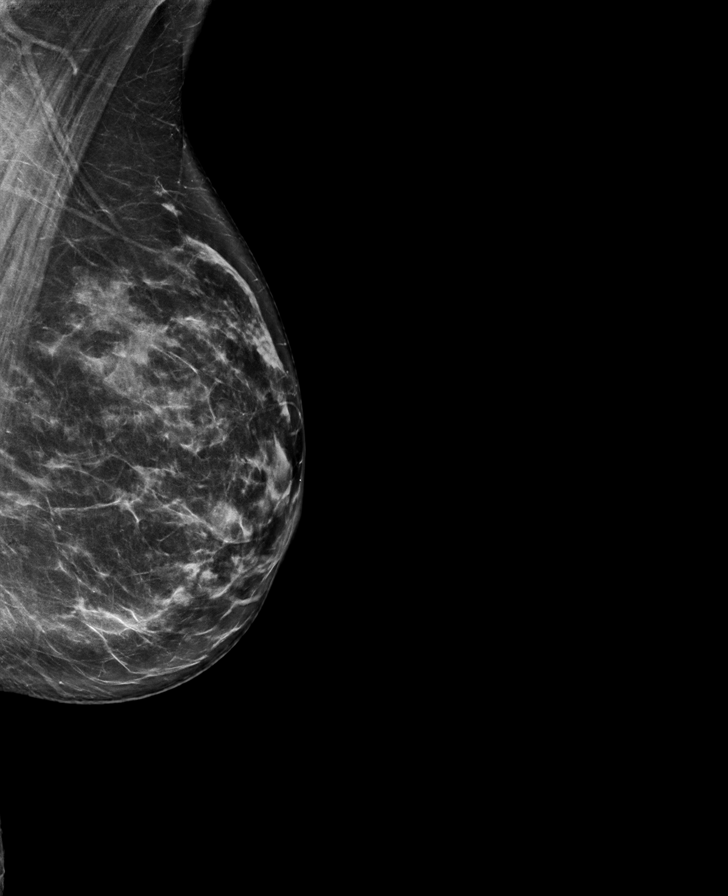

[R CC tomo · tomo slice 40/79.0]
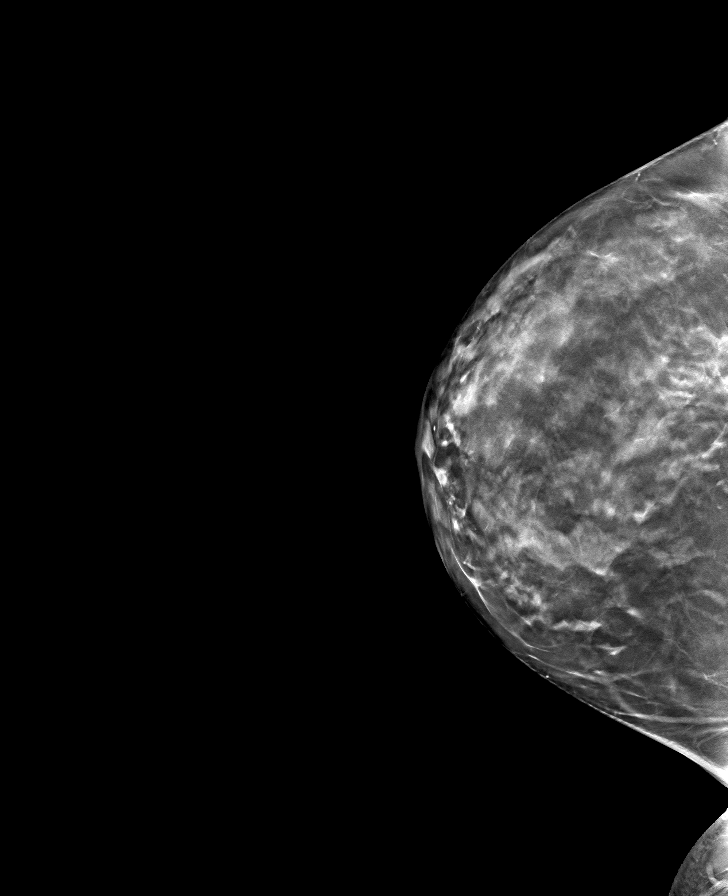

[R MLO tomo · tomo slice 41/82.0]
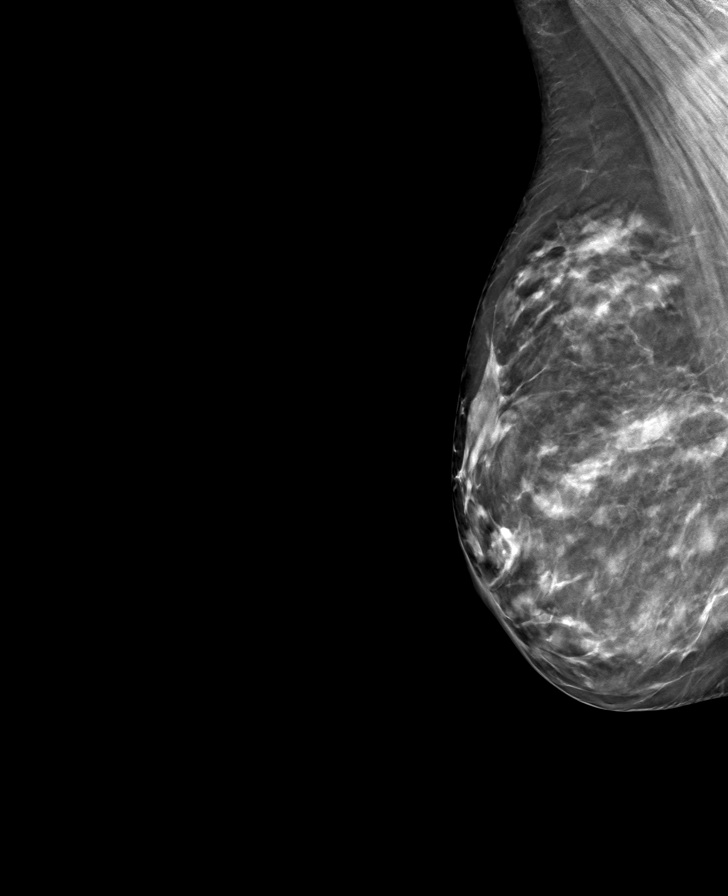

[L MLO tomo · tomo slice 43/84.0]
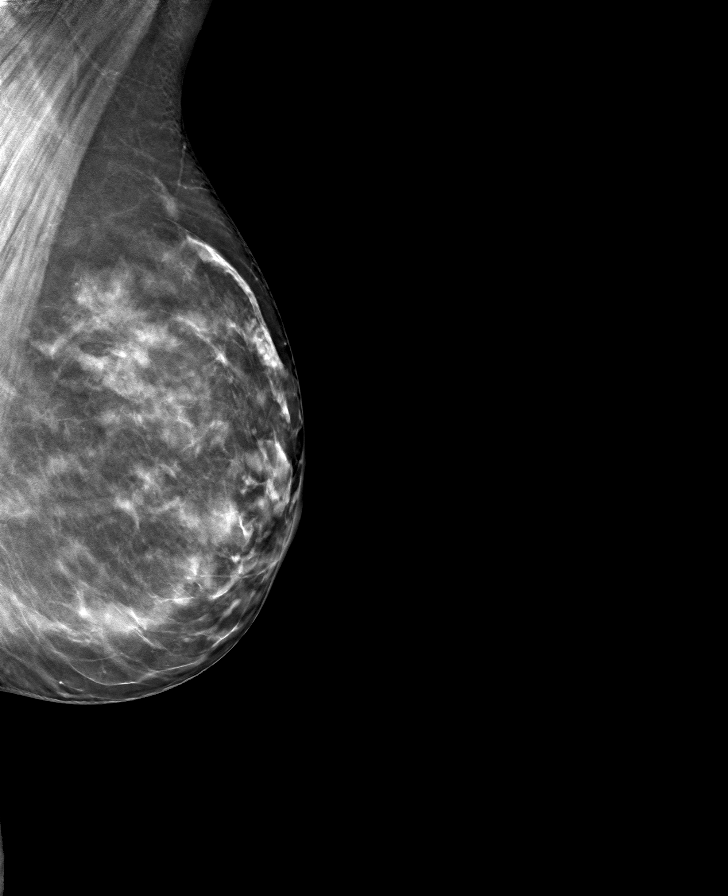

[L CC tomo · tomo slice 41/82.0]
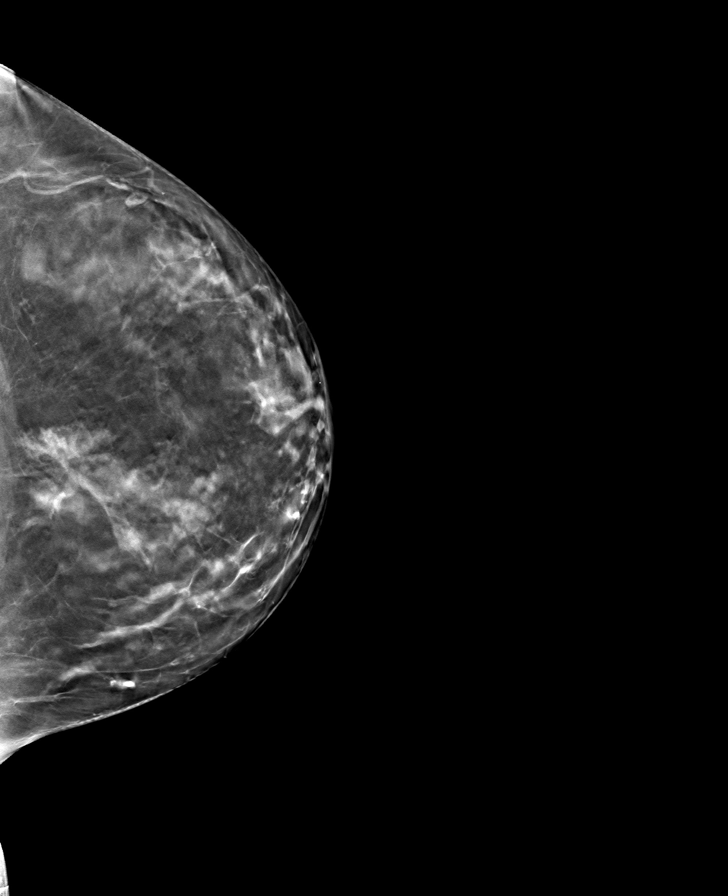

[8 of 24 positions shown; findings below may reference images not displayed]

Baseline

ACR Breast Density Category c: The breast tissue is heterogeneously
dense, which may obscure small masses.
FINDINGS: In the left breast, a possible focal asymmetry warrants further
evaluation. In the right breast, no findings suspicious for
malignancy.
IMPRESSION: Further evaluation is suggested for possible focal asymmetry in the
left breast.

RECOMMENDATION:
Diagnostic mammogram and possibly ultrasound of the left breast.
(Code:SC-9-33D)

The patient will be contacted regarding the findings, and additional
imaging will be scheduled.

BI-RADS CATEGORY  0: Incomplete. Need additional imaging evaluation
and/or prior mammograms for comparison.

## 2024-04-04 ENCOUNTER — Other Ambulatory Visit: Payer: Self-pay | Admitting: Internal Medicine

## 2024-04-04 DIAGNOSIS — Z170 Estrogen receptor positive status [ER+]: Secondary | ICD-10-CM

## 2024-04-07 ENCOUNTER — Other Ambulatory Visit: Payer: Self-pay | Admitting: Oncology

## 2024-05-21 ENCOUNTER — Ambulatory Visit
Admission: RE | Admit: 2024-05-21 | Discharge: 2024-05-21 | Disposition: A | Discharge status: Home / Self Care | Source: Ambulatory Visit | Attending: Internal Medicine | Admitting: Internal Medicine

## 2024-05-21 ENCOUNTER — Other Ambulatory Visit: Payer: Self-pay | Admitting: Oncology

## 2024-05-21 DIAGNOSIS — C50412 Malignant neoplasm of upper-outer quadrant of left female breast: Secondary | ICD-10-CM | POA: Diagnosis present

## 2024-05-21 DIAGNOSIS — R921 Mammographic calcification found on diagnostic imaging of breast: Secondary | ICD-10-CM

## 2024-05-21 DIAGNOSIS — Z170 Estrogen receptor positive status [ER+]: Secondary | ICD-10-CM | POA: Diagnosis present

## 2024-05-21 DIAGNOSIS — R928 Other abnormal and inconclusive findings on diagnostic imaging of breast: Secondary | ICD-10-CM

## 2024-05-23 ENCOUNTER — Ambulatory Visit
Admission: RE | Admit: 2024-05-23 | Discharge: 2024-05-23 | Disposition: A | Discharge status: Home / Self Care | Source: Ambulatory Visit | Attending: Oncology | Admitting: Oncology

## 2024-05-23 ENCOUNTER — Inpatient Hospital Stay: Payer: BC Managed Care – PPO | Attending: Oncology | Admitting: Oncology

## 2024-05-23 DIAGNOSIS — Z170 Estrogen receptor positive status [ER+]: Secondary | ICD-10-CM | POA: Diagnosis not present

## 2024-05-23 DIAGNOSIS — C50412 Malignant neoplasm of upper-outer quadrant of left female breast: Secondary | ICD-10-CM | POA: Diagnosis not present

## 2024-05-23 DIAGNOSIS — R921 Mammographic calcification found on diagnostic imaging of breast: Secondary | ICD-10-CM | POA: Diagnosis present

## 2024-05-23 DIAGNOSIS — R928 Other abnormal and inconclusive findings on diagnostic imaging of breast: Secondary | ICD-10-CM

## 2024-05-23 HISTORY — PX: BREAST BIOPSY: SHX20

## 2024-05-23 MED ORDER — LIDOCAINE-EPINEPHRINE 1 %-1:100000 IJ SOLN
20.0000 mL | Freq: Once | INTRAMUSCULAR | Status: AC
  Administered 2024-05-23: 20 mL
  Filled 2024-05-23: qty 20

## 2024-05-23 MED ORDER — LIDOCAINE 1 % OPTIME INJ - NO CHARGE
5.0000 mL | Freq: Once | INTRAMUSCULAR | Status: AC
Start: 2024-05-23 — End: 2024-05-23
  Administered 2024-05-23: 5 mL
  Filled 2024-05-23: qty 6

## 2024-05-23 NOTE — Progress Notes (Signed)
 [facility]  Telephone:(336) [phone] Fax:(336) [phone]  ID: Maria Young OB: [birth year]  MR#: [id number]  RDW#:[id number]  Patient Care Team: Sherial Bail, MD as PCP - General (Internal Medicine) Georgina Shasta POUR, RN as Oncology Nurse Navigator Jacobo Evalene PARAS, MD as Consulting Physician (Oncology) Lenn Aran, MD as Consulting Physician (Radiation Oncology) Tye Millet, DO as Consulting Physician (Surgery)  I connected with Maria Young on 05/23/24 at  3:30 PM EDT by video enabled telemedicine visit and verified that I am speaking with the correct person using two identifiers.   I discussed the limitations, risks, security and privacy concerns of performing an evaluation and management service by telemedicine and the availability of in-person appointments. I also discussed with the patient that there may be a patient responsible charge related to this service. The patient expressed understanding and agreed to proceed.   Other persons participating in the visit and their role in the encounter: Patient, MD.  Patient's location: Home. Provider's location: Clinic.  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  INTERVAL HISTORY: Patient agreed to video-assisted telemedicine visit for routine 66-month evaluation.  She recently had an abnormal mammogram requiring biopsy which occurred this morning.  Results are pending at time of dictation.  Patient has otherwise felt well.  She continues to tolerate tamoxifen  without significant side effects.  She has no neurologic complaints.  She denies any recent fevers or illnesses.  She has a good appetite and denies weight loss.  She has no chest pain, shortness of breath, cough, or hemoptysis.  She denies any nausea, vomiting, constipation, or diarrhea.  She has no urinary complaints.  Patient offers no further specific complaints  today.  REVIEW OF SYSTEMS:   Review of Systems  Constitutional: Negative.  Negative for fever, malaise/fatigue and weight loss.  Respiratory: Negative.  Negative for cough, hemoptysis and shortness of breath.   Cardiovascular: Negative.  Negative for chest pain and leg swelling.  Gastrointestinal: Negative.  Negative for abdominal pain.  Genitourinary: Negative.  Negative for dysuria.  Musculoskeletal: Negative.  Negative for back pain.  Skin: Negative.  Negative for rash.  Neurological: Negative.  Negative for dizziness, focal weakness, weakness and headaches.  Psychiatric/Behavioral: Negative.  The patient is not nervous/anxious.     As per HPI. Otherwise, a complete review of systems is negative.  PAST MEDICAL HISTORY: Past Medical History:  Diagnosis Date   Breast cancer (HCC) 04/13/2022   left (estrogen receptor positive)   DVT (deep venous thrombosis) (HCC) 01/26/2021   left leg   Heart murmur    Iron deficiency anemia    PE (pulmonary thromboembolism) (HCC) 06/11/2020   Personal history of radiation therapy    Pneumonia     PAST SURGICAL HISTORY: Past Surgical History:  Procedure Laterality Date   ANKLE FRACTURE SURGERY Right 2005   internal fixation   BREAST BIOPSY Left 04/13/2022   US  Core bx coil clip-positive   BREAST BIOPSY Right 05/23/2024   MM RT BREAST BX W LOC DEV 1ST LESION IMAGE BX SPEC STEREO GUIDE 05/23/2024 [facility]   BREAST LUMPECTOMY Left 04/29/2022   ORIF WRIST FRACTURE Left 10/30/2019   Procedure: OPEN REDUCTION INTERNAL FIXATION (ORIF) WRIST FRACTURE with percutaneous screw;  Surgeon: Edie Norleen PARAS, MD;  Location: [facility];  Service: Orthopedics;  Laterality: Left;   PARTIAL MASTECTOMY WITH AXILLARY SENTINEL LYMPH NODE BIOPSY Left 04/29/2022   Procedure: PARTIAL MASTECTOMY WITH AXILLARY  SENTINEL LYMPH NODE BIOPSY;  Surgeon: Tye Millet, DO;  Location: [facility];  Service: General;  Laterality: Left;    FAMILY HISTORY: Family History   Problem Relation Age of Onset   Diabetes Father    Heart disease Father    Hyperlipidemia Brother    Prostate cancer Paternal Grandfather        d. 53s   Breast cancer Other    Breast cancer Cousin        dx 60s   Breast cancer Maternal Great-grandmother     ADVANCED DIRECTIVES (Y/N):  N  HEALTH MAINTENANCE: Social History   Tobacco Use   Smoking status: Every Day    Current packs/day: 0.50    Types: Cigarettes   Smokeless tobacco: Never  Vaping Use   Vaping status: Never Used  Substance Use Topics   Alcohol use: Yes    Alcohol/week: 14.0 standard drinks of alcohol    Types: 14 Glasses of wine per week   Drug use: Not Currently    Types: Marijuana     Colonoscopy:  PAP:  Bone density:  Lipid panel:  Allergies  Allergen Reactions   Azithromycin Hives and Itching    Current Outpatient Medications  Medication Sig Dispense Refill   apixaban  (ELIQUIS ) 2.5 MG TABS tablet Take 2.5 mg by mouth 2 (two) times daily.     tamoxifen  (NOLVADEX ) 20 MG tablet TAKE 1 TABLET BY MOUTH EVERY DAY 90 tablet 0   Vitamin D, Ergocalciferol, (DRISDOL) 1.25 MG (50000 UNIT) CAPS capsule Take 50,000 Units by mouth once a week.     No current facility-administered medications for this visit.    OBJECTIVE: There were no vitals filed for this visit.    There is no height or weight on file to calculate BMI.    ECOG FS:0 - Asymptomatic  General: Well-developed, well-nourished, no acute distress. HEENT: Normocephalic. Neuro: Alert, answering all questions appropriately. Cranial nerves grossly intact. Psych: Normal affect.  LAB RESULTS:  Lab Results  Component Value Date   NA 137 06/11/2020   K 3.7 06/11/2020   CL 107 06/11/2020   CO2 23 06/11/2020   GLUCOSE 109 (H) 06/11/2020   BUN 10 06/11/2020   CREATININE 0.72 06/11/2020   CALCIUM 9.2 06/11/2020   GFRNONAA >60 06/11/2020   GFRAA >60 06/11/2020    Lab Results  Component Value Date   WBC 6.2 07/04/2022   HGB 11.1 (L)  07/04/2022   HCT 35.2 (L) 07/04/2022   MCV 91.2 07/04/2022   PLT 298 07/04/2022     STUDIES: MM RT BREAST BX W LOC DEV 1ST LESION IMAGE BX SPEC STEREO GUIDE Result Date: 05/23/2024 CLINICAL DATA:  Indeterminate RIGHT breast calcifications in the upper inner quadrant EXAM: RIGHT BREAST STEREOTACTIC CORE NEEDLE BIOPSY COMPARISON:  Previous exam(s). FINDINGS: The patient and I discussed the procedure of stereotactic-guided biopsy including benefits and alternatives. We discussed the high likelihood of a successful procedure. We discussed the risks of the procedure including infection, bleeding, tissue injury, clip migration, and inadequate sampling. Informed written consent was given. The usual time out protocol was performed immediately prior to the procedure. Using sterile technique and 1% lidocaine  and 1% lidocaine  with epinephrine  as local anesthetic, under stereotactic guidance, a 9 gauge vacuum assisted device was used to perform core needle biopsy of calcifications in the upper inner quadrant of the RIGHT breast using a superior approach. Specimen radiograph was performed showing representative calcifications. Specimens with calcifications are identified for pathology. Lesion quadrant: Upper inner At  the conclusion of the procedure, X shaped tissue marker clip was deployed into the biopsy cavity. Follow-up 2-view mammogram was performed and dictated separately. IMPRESSION: Stereotactic-guided biopsy of RIGHT breast calcifications in the upper inner quadrant. No apparent complications. Electronically Signed   By: Norleen Croak M.D.   On: 05/23/2024 08:43   MM CLIP PLACEMENT RIGHT Result Date: 05/23/2024 CLINICAL DATA:  Same day RIGHT breast stereotactic biopsy EXAM: 3D DIAGNOSTIC RIGHT MAMMOGRAM POST stereotactic BIOPSY COMPARISON:  Previous exam(s). ACR Breast Density Category c: The breasts are heterogeneously dense, which may obscure small masses. FINDINGS: 3D Mammographic images were obtained  following stereotactic guided biopsy of breast calcifications. The biopsy marking clip is in expected position at the site of biopsy. IMPRESSION: Appropriate positioning of the X shaped biopsy marking clip at the site of biopsy in the upper inner quadrant of the RIGHT breast. Final Assessment: Post Procedure Mammograms for Marker Placement Electronically Signed   By: Norleen Croak M.D.   On: 05/23/2024 08:42   MM 3D DIAGNOSTIC MAMMOGRAM BILATERAL BREAST Result Date: 05/21/2024 CLINICAL DATA:  Annual examination status post LEFT breast lumpectomy for invasive mammary carcinoma with mucinous features in June 2023, with negative margins. Patient received adjuvant radiation therapy. EXAM: DIGITAL DIAGNOSTIC BILATERAL MAMMOGRAM WITH TOMOSYNTHESIS AND CAD TECHNIQUE: Bilateral digital diagnostic mammography and breast tomosynthesis was performed. The images were evaluated with computer-aided detection. COMPARISON:  Previous exam(s). ACR Breast Density Category c: The breasts are heterogeneously dense, which may obscure small masses. FINDINGS: RIGHT: Spot magnification views demonstrate a 5 mm group of amorphous calcification in the upper slightly inner right breast middle depth. This is new compared to prior examinations. No additional suspicious findings are identified in the right breast. LEFT: Stable post lumpectomy changes in the upper-outer left breast. No suspicious findings are noted in the spot magnification view of the lumpectomy site. No new suspicious mass, calcification, or other findings are noted in the left breast. IMPRESSION: 1. RIGHT breast 5 mm group of amorphous calcification is indeterminate. Recommend further assessment with stereotactic guided biopsy. 2. Stable postlumpectomy changes in the LEFT breast. No mammographic evidence of malignancy in the LEFT breast. RECOMMENDATION: RIGHT breast stereotactic guided biopsy (1 site). I have discussed the findings and recommendations with the patient. The  biopsy procedure was discussed with the patient and questions were answered. Patient expressed their understanding of the biopsy recommendation. Patient will be scheduled for biopsy at her earliest convenience by the schedulers. Ordering provider will be notified. If applicable, a reminder letter will be sent to the patient regarding the next appointment. BI-RADS CATEGORY  4: Suspicious. Electronically Signed   By: Dirk Arrant M.D.   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  Oncotype DX score was low risk at 12.  Given the small size of her malignancy, and the fact that it is a grade 1, no chemotherapy was necessary.  Patient completed adjuvant XRT in September 2023.  Continue tamoxifen  for a total of 5 years completing in September 2028.  Her most recent mammogram on May 21, 2024 was reported as BI-RADS 4.  Biopsy occurred on May 23, 2024.  Results are pending at time of dictation.  No further  intervention is needed, will call patient with pathology results.  Return to clinic in 6 months with video-assisted telemedicine visit.   Iron deficiency anemia: Resolved. 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  Her first blood clot was noted on CT of the chest on June 11, 2020 which she states coincided with COVID-vaccine.  Patient was off treatment for several weeks and by report had a DVT on January 26, 2021. Unclear what work-up has been done up to this point.   Genetics: Given patient's age, she was previously given a genetics referral, but is unclear if patient has ever been tested.  I provided 20 minutes of face-to-face video visit time during this encounter which  included chart review, counseling, and coordination of care as documented above.   Patient expressed understanding and was in agreement with this plan. She also understands that She can call clinic at any time with any questions, concerns, or complaints.    Cancer Staging  Carcinoma of upper-outer quadrant of left breast in female, estrogen receptor positive (HCC) Staging form: Breast, AJCC 8th Edition - Clinical stage from 04/17/2022: Stage IA (cT1b, cN0, cM0, G1, ER+, PR+, HER2-) - Signed by Jacobo Evalene PARAS, MD on 04/17/2022 Stage prefix: Initial diagnosis Histologic grading system: 3 grade system  Evalene PARAS Jacobo, MD   05/23/2024 3:26 PM

## 2024-05-24 LAB — SURGICAL PATHOLOGY

## 2024-07-04 ENCOUNTER — Other Ambulatory Visit: Payer: Self-pay | Admitting: Oncology

## 2024-07-09 ENCOUNTER — Encounter: Payer: Self-pay | Admitting: Oncology

## 2024-10-05 ENCOUNTER — Other Ambulatory Visit: Payer: Self-pay | Admitting: Oncology

## 2024-11-25 ENCOUNTER — Inpatient Hospital Stay: Admitting: Oncology

## 2024-11-25 ENCOUNTER — Other Ambulatory Visit: Payer: Self-pay | Admitting: *Deleted

## 2024-11-25 DIAGNOSIS — Z170 Estrogen receptor positive status [ER+]: Secondary | ICD-10-CM | POA: Diagnosis not present

## 2024-11-25 DIAGNOSIS — C50412 Malignant neoplasm of upper-outer quadrant of left female breast: Secondary | ICD-10-CM | POA: Diagnosis not present

## 2024-11-25 NOTE — Progress Notes (Signed)
 " [facility]  Telephone:(336) [phone] Fax:(336) [phone]  ID: Maria Young OB: [birth year]  MR#: [id number]  RDW#:[id number]  Patient Care Team: Sherial Bail, MD as PCP - General (Internal Medicine) Georgina Shasta POUR, RN as Oncology Nurse Navigator Jacobo Evalene PARAS, MD as Consulting Physician (Oncology) Lenn Aran, MD as Consulting Physician (Radiation Oncology) Tye Millet, DO as Consulting Physician (Surgery)  I connected with Maria Young on 11/25/2024 at  3:30 PM EST by video enabled telemedicine visit and verified that I am speaking with the correct person using two identifiers.   I discussed the limitations, risks, security and privacy concerns of performing an evaluation and management service by telemedicine and the availability of in-person appointments. I also discussed with the patient that there may be a patient responsible charge related to this service. The patient expressed understanding and agreed to proceed.   Other persons participating in the visit and their role in the encounter: Patient, MD.  Patients location: Home. Providers location: Clinic.   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  INTERVAL HISTORY: Patient agreed to video-assisted telemedicine visit for her routine 54-month evaluation.  She currently feels well and is asymptomatic.  She is tolerating tamoxifen  and Eliquis  well without significant side effects.  She also recently started taking Effexor.  She has no neurologic complaints.  She denies any recent fevers or illnesses.  She has a good appetite and denies weight loss.  She has no chest pain, shortness of breath, cough, or hemoptysis.  She denies any nausea, vomiting, constipation, or diarrhea.  She has no urinary complaints.  Patient offers no specific complaints today.  REVIEW OF SYSTEMS:   Review of Systems   Constitutional: Negative.  Negative for fever, malaise/fatigue and weight loss.  Respiratory: Negative.  Negative for cough, hemoptysis and shortness of breath.   Cardiovascular: Negative.  Negative for chest pain and leg swelling.  Gastrointestinal: Negative.  Negative for abdominal pain.  Genitourinary: Negative.  Negative for dysuria.  Musculoskeletal: Negative.  Negative for back pain.  Skin: Negative.  Negative for rash.  Neurological: Negative.  Negative for dizziness, focal weakness, weakness and headaches.  Psychiatric/Behavioral: Negative.  The patient is not nervous/anxious.     As per HPI. Otherwise, a complete review of systems is negative.  PAST MEDICAL HISTORY: Past Medical History:  Diagnosis Date   Breast cancer (HCC) 04/13/2022   left (estrogen receptor positive)   DVT (deep venous thrombosis) (HCC) 01/26/2021   left leg   Heart murmur    Iron deficiency anemia    PE (pulmonary thromboembolism) (HCC) 06/11/2020   Personal history of radiation therapy    Pneumonia     PAST SURGICAL HISTORY: Past Surgical History:  Procedure Laterality Date   ANKLE FRACTURE SURGERY Right 2005   internal fixation   BREAST BIOPSY Left 04/13/2022   US  Core bx coil clip-positive   BREAST BIOPSY Right 05/23/2024   MM RT BREAST BX W LOC DEV 1ST LESION IMAGE BX SPEC STEREO GUIDE 05/23/2024 [facility]   BREAST LUMPECTOMY Left 04/29/2022   ORIF WRIST FRACTURE Left 10/30/2019   Procedure: OPEN REDUCTION INTERNAL FIXATION (ORIF) WRIST FRACTURE with percutaneous screw;  Surgeon: Edie Norleen PARAS, MD;  Location: [facility];  Service: Orthopedics;  Laterality: Left;   PARTIAL MASTECTOMY WITH AXILLARY SENTINEL LYMPH NODE BIOPSY Left 04/29/2022   Procedure: PARTIAL MASTECTOMY WITH AXILLARY SENTINEL LYMPH NODE BIOPSY;  Surgeon: Sakai, Isami,  DO;  Location: [facility];  Service: General;  Laterality: Left;    FAMILY HISTORY: Family History  Problem Relation Age of Onset   Diabetes Father     Heart disease Father    Hyperlipidemia Brother    Prostate cancer Paternal Grandfather        d. 24s   Breast cancer Other    Breast cancer Cousin        dx 79s   Breast cancer Maternal Great-grandmother     ADVANCED DIRECTIVES (Y/N):  N  HEALTH MAINTENANCE: Social History   Tobacco Use   Smoking status: Every Day    Current packs/day: 0.50    Types: Cigarettes   Smokeless tobacco: Never  Vaping Use   Vaping status: Never Used  Substance Use Topics   Alcohol use: Yes    Alcohol/week: 14.0 standard drinks of alcohol    Types: 14 Glasses of wine per week   Drug use: Not Currently    Types: Marijuana     Colonoscopy:  PAP:  Bone density:  Lipid panel:  Allergies  Allergen Reactions   Azithromycin Hives and Itching    Current Outpatient Medications  Medication Sig Dispense Refill   apixaban  (ELIQUIS ) 2.5 MG TABS tablet Take 2.5 mg by mouth 2 (two) times daily.     tamoxifen  (NOLVADEX ) 20 MG tablet TAKE 1 TABLET BY MOUTH EVERY DAY 90 tablet 0   Vitamin D, Ergocalciferol, (DRISDOL) 1.25 MG (50000 UNIT) CAPS capsule Take 50,000 Units by mouth once a week.     No current facility-administered medications for this visit.    OBJECTIVE: There were no vitals filed for this visit.    There is no height or weight on file to calculate BMI.    ECOG FS:0 - Asymptomatic  General: Well-developed, well-nourished, no acute distress. HEENT: Normocephalic. Neuro: Alert, answering all questions appropriately. Cranial nerves grossly intact. Psych: Normal affect.  LAB RESULTS:  Lab Results  Component Value Date   NA 137 06/11/2020   K 3.7 06/11/2020   CL 107 06/11/2020   CO2 23 06/11/2020   GLUCOSE 109 (H) 06/11/2020   BUN 10 06/11/2020   CREATININE 0.72 06/11/2020   CALCIUM 9.2 06/11/2020   GFRNONAA >60 06/11/2020   GFRAA >60 06/11/2020    Lab Results  Component Value Date   WBC 6.2 07/04/2022   HGB 11.1 (L) 07/04/2022   HCT 35.2 (L) 07/04/2022   MCV 91.2  07/04/2022   PLT 298 07/04/2022     STUDIES: No results found.   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  Oncotype DX score was low risk at 12.  Given the small size of her malignancy, and the fact that it is a grade 1, no chemotherapy was necessary.  Patient completed adjuvant XRT in September 2023.  Continue tamoxifen  for a total of 5 years completing in September 2028.  Her most recent mammogram on May 21, 2024 was reported as BI-RADS 4, but biopsy was negative without report of malignancy.  Repeat mammogram in June 2026.  Patient to video-assisted telemedicine visit after her mammogram to discuss the results and further evaluation.    Iron deficiency anemia: Resolved. 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  Her first blood clot was noted on CT of the chest on June 11, 2020 which she states coincided with COVID-vaccine.  Patient was off treatment for several weeks and by report had a DVT on January 26, 2021. Unclear what work-up has been done up to this point.   Genetics: Given patient's age, she was previously given a genetics referral, but is unclear if patient has ever been tested.  I provided 20 minutes of face-to-face video visit time during this encounter which included chart review, counseling, and coordination of care as documented above.   Patient expressed understanding and was in agreement with this plan. She also understands that She can call clinic at any time with any questions, concerns, or complaints.    Cancer Staging  Carcinoma of upper-outer quadrant of left breast in female, estrogen receptor positive (HCC) Staging form: Breast, AJCC 8th Edition - Clinical  stage from 04/17/2022: Stage IA (cT1b, cN0, cM0, G1, ER+, PR+, HER2-) - Signed by Jacobo Evalene PARAS, MD on 04/17/2022 Stage prefix: Initial diagnosis Histologic grading system: 3 grade system  Evalene PARAS Jacobo, MD   11/25/2024 3:55 PM     "

## 2024-11-26 ENCOUNTER — Other Ambulatory Visit: Payer: Self-pay | Admitting: Oncology

## 2024-11-26 DIAGNOSIS — Z170 Estrogen receptor positive status [ER+]: Secondary | ICD-10-CM

## 2025-06-02 ENCOUNTER — Inpatient Hospital Stay: Admitting: Oncology
